# Patient Record
Sex: Female | Born: 1955
Health system: Southern US, Community
[De-identification: ages and names within clinical notes are randomized; demographics above are authoritative.]

## PROBLEM LIST (undated history)

## (undated) DIAGNOSIS — R51 Headache: Secondary | ICD-10-CM

## (undated) DIAGNOSIS — K219 Gastro-esophageal reflux disease without esophagitis: Secondary | ICD-10-CM

## (undated) DIAGNOSIS — F3281 Premenstrual dysphoric disorder: Secondary | ICD-10-CM

## (undated) DIAGNOSIS — Z9889 Other specified postprocedural states: Secondary | ICD-10-CM

## (undated) DIAGNOSIS — J302 Other seasonal allergic rhinitis: Secondary | ICD-10-CM

## (undated) DIAGNOSIS — R112 Nausea with vomiting, unspecified: Secondary | ICD-10-CM

## (undated) DIAGNOSIS — G5601 Carpal tunnel syndrome, right upper limb: Secondary | ICD-10-CM

## (undated) DIAGNOSIS — Z8719 Personal history of other diseases of the digestive system: Secondary | ICD-10-CM

## (undated) DIAGNOSIS — Z87898 Personal history of other specified conditions: Secondary | ICD-10-CM

## (undated) HISTORY — DX: Other seasonal allergic rhinitis: J30.2

## (undated) HISTORY — DX: Headache: R51

---

## 1998-03-21 ENCOUNTER — Other Ambulatory Visit: Admission: RE | Admit: 1998-03-21 | Discharge: 1998-03-21 | Payer: Self-pay | Admitting: Obstetrics and Gynecology

## 1998-06-27 ENCOUNTER — Encounter: Payer: Self-pay | Admitting: Orthopedic Surgery

## 1998-06-27 ENCOUNTER — Ambulatory Visit (HOSPITAL_COMMUNITY): Admission: RE | Admit: 1998-06-27 | Discharge: 1998-06-27 | Payer: Self-pay | Admitting: Orthopedic Surgery

## 1998-07-31 ENCOUNTER — Encounter: Payer: Self-pay | Admitting: Orthopedic Surgery

## 1998-07-31 ENCOUNTER — Ambulatory Visit (HOSPITAL_COMMUNITY): Admission: RE | Admit: 1998-07-31 | Discharge: 1998-07-31 | Payer: Self-pay | Admitting: Orthopedic Surgery

## 1999-04-22 ENCOUNTER — Other Ambulatory Visit: Admission: RE | Admit: 1999-04-22 | Discharge: 1999-04-22 | Payer: Self-pay | Admitting: Obstetrics and Gynecology

## 2000-04-29 ENCOUNTER — Other Ambulatory Visit: Admission: RE | Admit: 2000-04-29 | Discharge: 2000-04-29 | Payer: Self-pay | Admitting: Obstetrics and Gynecology

## 2001-05-22 ENCOUNTER — Other Ambulatory Visit: Admission: RE | Admit: 2001-05-22 | Discharge: 2001-05-22 | Payer: Self-pay | Admitting: Obstetrics and Gynecology

## 2002-07-05 ENCOUNTER — Other Ambulatory Visit: Admission: RE | Admit: 2002-07-05 | Discharge: 2002-07-05 | Payer: Self-pay | Admitting: Obstetrics and Gynecology

## 2003-07-09 ENCOUNTER — Other Ambulatory Visit: Admission: RE | Admit: 2003-07-09 | Discharge: 2003-07-09 | Payer: Self-pay | Admitting: Obstetrics and Gynecology

## 2004-05-15 ENCOUNTER — Other Ambulatory Visit: Admission: RE | Admit: 2004-05-15 | Discharge: 2004-05-15 | Payer: Self-pay | Admitting: Obstetrics and Gynecology

## 2004-09-21 ENCOUNTER — Encounter: Admission: RE | Admit: 2004-09-21 | Discharge: 2004-09-21 | Payer: Self-pay | Admitting: Sports Medicine

## 2004-10-19 ENCOUNTER — Encounter: Admission: RE | Admit: 2004-10-19 | Discharge: 2004-10-19 | Payer: Self-pay | Admitting: Sports Medicine

## 2005-05-05 ENCOUNTER — Other Ambulatory Visit: Admission: RE | Admit: 2005-05-05 | Discharge: 2005-05-05 | Payer: Self-pay | Admitting: Obstetrics and Gynecology

## 2009-10-30 ENCOUNTER — Encounter: Admission: RE | Admit: 2009-10-30 | Discharge: 2009-10-30 | Payer: Self-pay | Admitting: Gastroenterology

## 2011-02-15 ENCOUNTER — Encounter (HOSPITAL_BASED_OUTPATIENT_CLINIC_OR_DEPARTMENT_OTHER)
Admission: RE | Admit: 2011-02-15 | Discharge: 2011-02-15 | Disposition: A | Discharge status: Home / Self Care | Payer: BC Managed Care – PPO | Source: Ambulatory Visit | Attending: Orthopedic Surgery | Admitting: Orthopedic Surgery

## 2011-02-15 LAB — BASIC METABOLIC PANEL
BUN: 12 mg/dL (ref 6–23)
CO2: 24 mEq/L (ref 19–32)
Calcium: 8.7 mg/dL (ref 8.4–10.5)
Chloride: 105 mEq/L (ref 96–112)
Creatinine, Ser: 0.75 mg/dL (ref 0.50–1.10)
GFR calc Af Amer: >60 mL/min (ref >60)
GFR calc non Af Amer: >60 mL/min (ref >60)
Glucose, Bld: 93 mg/dL (ref 70–99)
Potassium: 4 mEq/L (ref 3.5–5.1)
Sodium: 139 mEq/L (ref 135–145)

## 2011-02-18 ENCOUNTER — Ambulatory Visit (HOSPITAL_BASED_OUTPATIENT_CLINIC_OR_DEPARTMENT_OTHER)
Admission: RE | Admit: 2011-02-18 | Discharge: 2011-02-19 | Disposition: A | Discharge status: Home / Self Care | Payer: BC Managed Care – PPO | Source: Ambulatory Visit | Attending: Orthopedic Surgery | Admitting: Orthopedic Surgery

## 2011-02-18 DIAGNOSIS — M2340 Loose body in knee, unspecified knee: Secondary | ICD-10-CM | POA: Insufficient documentation

## 2011-02-18 DIAGNOSIS — M2350 Chronic instability of knee, unspecified knee: Secondary | ICD-10-CM | POA: Insufficient documentation

## 2011-02-18 DIAGNOSIS — Z01810 Encounter for preprocedural cardiovascular examination: Secondary | ICD-10-CM | POA: Insufficient documentation

## 2011-02-18 DIAGNOSIS — Z01812 Encounter for preprocedural laboratory examination: Secondary | ICD-10-CM | POA: Insufficient documentation

## 2011-02-18 DIAGNOSIS — M23302 Other meniscus derangements, unspecified lateral meniscus, unspecified knee: Secondary | ICD-10-CM | POA: Insufficient documentation

## 2011-02-18 HISTORY — PX: KNEE ARTHROSCOPY W/ ACL RECONSTRUCTION: SHX1858

## 2011-02-18 LAB — POCT HEMOGLOBIN-HEMACUE: Hemoglobin: 11.9 g/dL — ABNORMAL LOW (ref 12.0–15.0)

## 2011-03-04 NOTE — Op Note (Signed)
NAME:  Erin Arnold, Erin Arnold NO.:  [id number]  MEDICAL RECORD NO.:  [id number]  LOCATION:                                 FACILITY:  PHYSICIAN:  Loreta Ave, M.D. DATE OF BIRTH:  1955-09-05  DATE OF PROCEDURE:  02/18/2011 DATE OF DISCHARGE:                              OPERATIVE REPORT   PREOPERATIVE DIAGNOSES: 1. Left knee, remote anterior cruciate ligament reconstruction in     1998. 2. Traumatic injury with tearing of graft and instability. 3. Lateral meniscus tear.  POSTOPERATIVE DIAGNOSES: 1. Left knee, remote anterior cruciate ligament reconstruction in     1998. 2. Traumatic injury with tearing of graft and instability. 3. Lateral meniscus tear. 4. Grade 2 and mild grade 3 changes, patellofemoral joint, lateral     tibial plateau. 5. New acute full-thickness grade 4 lesion, medial femoral condyle 4 x     4 mm with chondral loose body.  PROCEDURES: 1. Left knee exam under anesthesia, arthroscopy. 2. Tricompartmental chondroplasty. 3. Removal of loose fragment and microfracturing medial femoral     condyle. 4. Debridement of lateral meniscus tear. 5. Arthroscopic endoscopic ACL reconstruction. 6. Achilles allograft. 7. Bioabsorbable screw fixation above below with PushLock fixation     distally.  SURGEON:  Loreta Ave, MD  ASSISTANT:  Genene Churn. Barry Dienes, PA was present throughout the entire case necessary for timely completion of procedure.  ANESTHESIA:  General.  ESTIMATED BLOOD LOSS:  Minimal.  SPECIMENS:  None.  CULTURES:  None.  COMPLICATIONS:  None.  DRESSING:  Soft compressive with knee immobilizer.  TOURNIQUET TIME:  1 hour.  DESCRIPTION OF PROCEDURE:  The patient was brought to the operating room, placed on the operating table in supine position.  After adequate anesthesia had been obtained, knee examined.  Full motion.  Positive Lachman, drawer, and pivot shift.  Other ligament stable.  Tourniquet applied.  Prepped and  draped in usual sterile fashion.  Exsanguinated with elevation, Esmarch tourniquet inflated to 350 mmHg.  Two portals, one each medial and lateral parapatellar.  Arthroscope introduced.  Knee distended and inspected.  Some focal grade 2 chondromalacia patellofemoral joint on the patella and trochlea debrided.  Good tracking.  Lateral meniscus extensive, complex tearing anterior half. The anterior half removed, tapered into remaining meniscus.  Grade 2, mild grade 3 changes on the plateau debrided.  Medial meniscus intact. Most of the compartment are good except, right the middle of the weightbearing dome medial half.  There was a 4 x 4 mm full-thickness acute chondral fracture, unstable, debrided.  Trimmed to sharp margin. Treated with microfracturing.  Fluid pressure reduced to confirm good bleeding.  ACL and marked stretching attenuation was left.  Debrided out.  Some spurring into the notch debrided, opened with notchplasty.  A graft was prepared for 10-mm tunnels using Achilles with bone peg at one end.  The tunnels were recreated with a guidewire from an incision next to tibial tubercle out through the footprint, overdrilled with 10-mm reamer.  Femoral guide was inserted across the tunnel on the back cortex of the femur.  Guidewire driven, overdrilled there with a 10-mm reamer. Debris cleared throughout.  Tunnels were found  to be in good position. Tubing passer inserted across both tunnels out through a stab wound anterolateral thigh.  Nitinol wire brought in medial portal, out through femoral tunnel.  The graft attached to the passer, pulled in across the knee seating well on both tunnels.  Fixed with tensioning at 70 degrees with bioabsorbable 9-mm screws from inside out the femur and outside in the tibia.  Sutures were brought out the tibial tunnel and also reacted with a PushLock pre-drilled 3.5 mm distal.  When that was complete, the knee was examined.  Excellent stability.  Good  clearance of graft when viewed through full motion.  Very pleased with stability as well as motion and clearance.  Entire knee exam arthroscopically.  No other findings appreciated.  Instruments and fluid removed.  Portals closed with nylon.  Incision closed with subcutaneous and subcuticular Vicryl. Sterile compressive dressing applied.  Tourniquet deflated and removed. Knee immobilizer applied.  Anesthesia reversed.  Brought to the recovery room.  Tolerated surgery well.  No complications.     Loreta Ave, M.D.     DFM/MEDQ  D:  02/18/2011  T:  02/19/2011  Job:  [id number]  Electronically Signed by Mckinley Jewel M.D. on 03/04/2011 03:42:12 PM

## 2011-11-05 ENCOUNTER — Encounter: Payer: Self-pay | Admitting: Cardiovascular Disease

## 2011-11-05 ENCOUNTER — Ambulatory Visit (INDEPENDENT_AMBULATORY_CARE_PROVIDER_SITE_OTHER): Payer: BC Managed Care – PPO | Admitting: Cardiovascular Disease

## 2011-11-05 VITALS — BP 104/70 | HR 88 | Ht 68.0 in | Wt 180.0 lb

## 2011-11-05 DIAGNOSIS — R002 Palpitations: Secondary | ICD-10-CM | POA: Insufficient documentation

## 2011-11-05 LAB — LIPID PANEL
Cholesterol: 145 mg/dL (ref 0–200)
HDL: 32.3 mg/dL — ABNORMAL LOW (ref >39.00)
LDL Cholesterol: 90 mg/dL (ref 0–99)
Total CHOL/HDL Ratio: 4
Triglycerides: 112 mg/dL (ref 0.0–149.0)
VLDL: 22.4 mg/dL (ref 0.0–40.0)

## 2011-11-05 LAB — TSH: TSH: 1.14 u[IU]/mL (ref 0.35–5.50)

## 2011-11-05 LAB — BASIC METABOLIC PANEL
BUN: 13 mg/dL (ref 6–23)
CO2: 30 mEq/L (ref 19–32)
Calcium: 8.8 mg/dL (ref 8.4–10.5)
Chloride: 101 mEq/L (ref 96–112)
Creatinine, Ser: 0.9 mg/dL (ref 0.4–1.2)
GFR: 69.13 mL/min (ref >60.00)
Glucose, Bld: 98 mg/dL (ref 70–99)
Potassium: 3.9 mEq/L (ref 3.5–5.1)
Sodium: 137 mEq/L (ref 135–145)

## 2011-11-05 LAB — HEPATIC FUNCTION PANEL
ALT: 35 U/L (ref 0–35)
AST: 24 U/L (ref 0–37)
Albumin: 4 g/dL (ref 3.5–5.2)
Alkaline Phosphatase: 52 U/L (ref 39–117)
Bilirubin, Direct: 0 mg/dL (ref 0.0–0.3)
Total Bilirubin: 0.1 mg/dL — ABNORMAL LOW (ref 0.3–1.2)
Total Protein: 6.8 g/dL (ref 6.0–8.3)

## 2011-11-05 NOTE — Assessment & Plan Note (Addendum)
Erin Arnold presents with episodes of palpitations. Clinically these sound like premature ventricular contractions. She's on fairly high dose of HCTZ and only takes OTC potassium supplements.  We will check a fasting labs today as well as a TSH. We'll decrease her HCTZ to 25 mg a day. I'll see her again in 3 months for followup visit.  We talked about getting a monitor but at this point I think these arrhythmias are fairly benign.  She also has a very soft heart murmur but will wait on doing an echocardiogram.  She is agreeable to this.

## 2011-11-05 NOTE — Patient Instructions (Signed)
Decrease your dose of HCTZ to 25mg .  Your physician recommends that you schedule a follow-up appointment in: 3 months with Dr. Elease Hashimoto.  Labs today:  TSH, Lipids, Liver, BMET.

## 2011-11-05 NOTE — Progress Notes (Signed)
    Erin Arnold Date of Birth  October 14, 1955 [facility]     [place] Office  [address]    [address]   [address] [place], [place]  [address]    [place], [place]  [address] [phone]  Fax  [phone]  [phone]  Fax [phone]  Problem List: 1. Palpitation 2. Hx of colitis - following a C.Diff infection  History of Present Illness:  Erin Arnold is a 56 yo with a history of palpitations. She's had these palpitations for several years. These are typically described as heart irregularities followed by very strong heartbeats. On one occasion she's had a prolonged episode of palpitations.  These typically occur at night when she's relaxing. They also occur during the day. She does  not have any specific association for these palpitations. These episodes are associated with syncope, presyncope, chest pain, or shortness breath.  Current Outpatient Prescriptions on File Prior to Visit  Medication Sig Dispense Refill  . CA-MG-VIT D-L METHYLFOL-B6-B12 PO Take by mouth daily.      . hydrochlorothiazide (HYDRODIURIL) 50 MG tablet Take 50 mg by mouth daily.      . sertraline (ZOLOFT) 50 MG tablet Take 50 mg by mouth daily.      . traZODone (DESYREL) 50 MG tablet Take 50 mg by mouth at bedtime as needed.      . zolpidem (AMBIEN) 10 MG tablet Take 10 mg by mouth at bedtime as needed.        No Known Allergies  Past Medical History  Diagnosis Date  . Irregular heart rate   . Headache   . Seasonal allergies   . Constipation   . Acute infection of stomach lining 2011    C-Diff  . Colitis, ulcerative     Past Surgical History  Procedure Date  . Anterior cruciate ligament repair 1998, 02/15/12    Right and Left    History  Smoking status  . Former Smoker  . Quit date: 07/26/1976  Smokeless tobacco  . Not on file    History  Alcohol Use: Not on file    No family history on file.  Reviw of Systems:  Reviewed in the HPI.  All other systems are  negative.  Physical Exam: Blood pressure 104/70, pulse 88, height 5\' 8"  (1.727 m), weight 180 lb (81.647 kg). General: Well developed, well nourished, in no acute distress.  Head: Normocephalic, atraumatic, sclera non-icteric, mucus membranes are moist,   Neck: Supple. Carotids are 2 + without bruits. No JVD  Lungs: Clear bilaterally to auscultation.  Heart: regular rate.  normal  S1 S2. She has a 1-2 / 6 systolic murmur at the LSB.  No radiation.  Abdomen: Soft, non-tender, non-distended with normal bowel sounds. No hepatomegaly. No rebound/guarding. No masses.  Msk:  Strength and tone are normal  Extremities: No clubbing or cyanosis. No edema.  Distal pedal pulses are 2+ and equal bilaterally.  Neuro: Alert and oriented X 3. Moves all extremities spontaneously.  Psych:  Responds to questions appropriately with a normal affect.  ECG: 11/05/11- NSR. Normal ECG  Assessment / Plan:

## 2012-02-09 ENCOUNTER — Ambulatory Visit (INDEPENDENT_AMBULATORY_CARE_PROVIDER_SITE_OTHER): Payer: BC Managed Care – PPO | Admitting: Cardiovascular Disease

## 2012-02-09 ENCOUNTER — Encounter: Payer: Self-pay | Admitting: Cardiovascular Disease

## 2012-02-09 VITALS — BP 126/80 | HR 84 | Ht 68.0 in | Wt 179.0 lb

## 2012-02-09 DIAGNOSIS — I10 Essential (primary) hypertension: Secondary | ICD-10-CM

## 2012-02-09 MED ORDER — METOPROLOL SUCCINATE ER 25 MG PO TB24: 25.0000 mg | ORAL_TABLET | Freq: Every day | ORAL | Status: DC

## 2012-02-09 NOTE — Progress Notes (Signed)
Erin Arnold Date of Birth  09-Aug-1955 [facility]     [place] Office  [address]    [address]   [address] [place], [place]  [address]    [place], [place]  [address] [phone]  Fax  [phone]  [phone]  Fax [phone]  Problem List: 1. Palpitation 2. Hx of colitis - following a C.Diff infection  History of Present Illness:  Erin Arnold is a 56 yo with a history of palpitations. She's had these palpitations for several years. These are typically described as heart irregularities followed by very strong heartbeats. On one occasion she's had a prolonged episode of palpitations.  These typically occur at night when she's relaxing. They also occur during the day. She does  not have any specific association for these palpitations. These episodes are associated with syncope, presyncope, chest pain, or shortness breath.  She has been having some chest pain / jaw pain.  She has had jaw pain constantly for the past several weeks.  Worsening fatigue after exercising.    Current Outpatient Prescriptions on File Prior to Visit  Medication Sig Dispense Refill  . CA-MG-VIT D-L METHYLFOL-B6-B12 PO Take by mouth daily.      . fish oil-omega-3 fatty acids 1000 MG capsule Take 1 g by mouth daily.       . hydrochlorothiazide (HYDRODIURIL) 50 MG tablet Take 0.5 tablets (25 mg total) by mouth daily.      [name] LYSINE PO Take by mouth daily.      . magnesium 30 MG tablet 30 mg daily.      . Misc Natural Products (OSTEO BI-FLEX ADV JOINT SHIELD PO) Take by mouth daily.      . polyethylene glycol (MIRALAX / GLYCOLAX) packet Take 17 g by mouth daily.      . Probiotic Product (PROBIOTIC FORMULA PO) Take by mouth daily.      . sertraline (ZOLOFT) 50 MG tablet Take 50 mg by mouth daily.      . traZODone (DESYREL) 50 MG tablet Take 50 mg by mouth at bedtime as needed.      . zolpidem (AMBIEN) 10 MG tablet Take 10 mg by mouth at bedtime as needed.        No Known Allergies  Past  Medical History  Diagnosis Date  . Irregular heart rate   . Headache   . Seasonal allergies   . Constipation   . Acute infection of stomach lining 2011    C-Diff  . Colitis, ulcerative     Past Surgical History  Procedure Date  . Anterior cruciate ligament repair 1998, 02/15/12    Right and Left    History  Smoking status  . Former Smoker  . Quit date: 07/26/1976  Smokeless tobacco  . Not on file    History  Alcohol Use: Not on file    No family history on file.  Reviw of Systems:  Reviewed in the HPI.  All other systems are negative.  Physical Exam: Blood pressure 126/80, pulse 84, height 5\' 8"  (1.727 m), weight 179 lb (81.194 kg). General: Well developed, well nourished, in no acute distress.  Head: Normocephalic, atraumatic, sclera non-icteric, mucus membranes are moist,   Neck: Supple. Carotids are 2 + without bruits. No JVD  Lungs: Clear bilaterally to auscultation.  Heart: regular rate.  normal  S1 S2. She has a 1-2 / 6 systolic murmur at the LSB.  No radiation.  Abdomen: Soft, non-tender, non-distended with normal bowel  sounds. No hepatomegaly. No rebound/guarding. No masses.  Msk:  Strength and tone are normal  Extremities: No clubbing or cyanosis. No edema.  Distal pedal pulses are 2+ and equal bilaterally.  Neuro: Alert and oriented X 3. Moves all extremities spontaneously.  Psych:  Responds to questions appropriately with a normal affect.  ECG: 11/05/11- NSR. Normal ECG  Assessment / Plan:

## 2012-02-09 NOTE — Assessment & Plan Note (Signed)
Erin Arnold continues to have episodes of palpitations. I suspect a lot of this is due to anxiety. None of her symptoms sound like coronary disease. She has chronic jaw pain but I do not think this is related tocoronary problems.  I've encouraged her to exercise on regular basis. We'll add metoprolol XL 25 mg a day to her medical regimen. I see her back in 3 months for followup office visit. She will call sooner if she has any additional problems.

## 2012-02-09 NOTE — Patient Instructions (Signed)
Your physician recommends that you schedule a follow-up appointment in: 3 months   Your physician has recommended you make the following change in your medication:   Start toprol xl 25 mg once a day.  Your physician recommends that you return for lab work in: 3 months/ bmet  REDUCE HIGH SODIUM FOODS LIKE CANNED SOUP, GRAVY, SAUCES, READY PREPARED FOODS LIKE FROZEN FOODS; LEAN CUISINE, LASAGNA. BACON, SAUSAGE, LUNCH MEAT, FAST FOODS.[name]    DASH Diet The DASH diet stands for "Dietary Approaches to Stop Hypertension." It is a healthy eating plan that has been shown to reduce high blood pressure (hypertension) in as little as 14 days, while also possibly providing other significant health benefits. These other health benefits include reducing the risk of breast cancer after menopause and reducing the risk of type 2 diabetes, heart disease, colon cancer, and stroke. Health benefits also include weight loss and slowing kidney failure in patients with chronic kidney disease.  DIET GUIDELINES  Limit salt (sodium). Your diet should contain less than 1500 mg of sodium daily.   Limit refined or processed carbohydrates. Your diet should include mostly whole grains. Desserts and added sugars should be used sparingly.   Include small amounts of heart-healthy fats. These types of fats include nuts, oils, and tub margarine. Limit saturated and trans fats. These fats have been shown to be harmful in the body.  CHOOSING FOODS  The following food groups are based on a 2000 calorie diet. See your Registered Dietitian for individual calorie needs. Grains and Grain Products (6 to 8 servings daily)  Eat More Often: Whole-wheat bread, brown rice, whole-grain or wheat pasta, quinoa, popcorn without added fat or salt (air popped).   Eat Less Often: White bread, white pasta, white rice, cornbread.  Vegetables (4 to 5 servings daily)  Eat More Often: Fresh, frozen, and canned vegetables. Vegetables may be raw,  steamed, roasted, or grilled with a minimal amount of fat.   Eat Less Often/Avoid: Creamed or fried vegetables. Vegetables in a cheese sauce.  Fruit (4 to 5 servings daily)  Eat More Often: All fresh, canned (in natural juice), or frozen fruits. Dried fruits without added sugar. One hundred percent fruit juice ( cup [237 mL] daily).   Eat Less Often: Dried fruits with added sugar. Canned fruit in light or heavy syrup.  Foot Locker, Fish, and Poultry (2 servings or less daily. One serving is 3 to 4 oz [85-114 g]).  Eat More Often: Ninety percent or leaner ground beef, tenderloin, sirloin. Round cuts of beef, chicken breast, [place] breast. All fish. Grill, bake, or broil your meat. Nothing should be fried.   Eat Less Often/Avoid: Fatty cuts of meat, [place], or chicken leg, thigh, or wing. Fried cuts of meat or fish.  Dairy (2 to 3 servings)  Eat More Often: Low-fat or fat-free milk, low-fat plain or light yogurt, reduced-fat or part-skim cheese.   Eat Less Often/Avoid: Milk (whole, 2%, skim, or chocolate).Whole milk yogurt. Full-fat cheeses.  Nuts, Seeds, and Legumes (4 to 5 servings per week)  Eat More Often: All without added salt.   Eat Less Often/Avoid: Salted nuts and seeds, canned beans with added salt.  Fats and Sweets (limited)  Eat More Often: Vegetable oils, tub margarines without trans fats, sugar-free gelatin. Mayonnaise and salad dressings.   Eat Less Often/Avoid: Coconut oils, palm oils, butter, stick margarine, cream, half and half, cookies, candy, pie.  FOR MORE INFORMATION The Dash Diet Eating Plan: www.dashdiet.org Document Released: 07/01/2011 Document Reviewed:  06/21/2011 Exit[employer] Patient Information 2012 [place], [place].

## 2012-04-14 ENCOUNTER — Other Ambulatory Visit: Payer: Self-pay | Admitting: Orthopedic Surgery

## 2012-04-14 DIAGNOSIS — M25522 Pain in left elbow: Secondary | ICD-10-CM

## 2012-04-16 ENCOUNTER — Ambulatory Visit
Admission: RE | Admit: 2012-04-16 | Discharge: 2012-04-16 | Disposition: A | Discharge status: Home / Self Care | Payer: BC Managed Care – PPO | Source: Ambulatory Visit | Attending: Orthopedic Surgery | Admitting: Orthopedic Surgery

## 2012-04-16 DIAGNOSIS — M25522 Pain in left elbow: Secondary | ICD-10-CM

## 2012-05-15 ENCOUNTER — Encounter: Payer: Self-pay | Admitting: Cardiovascular Disease

## 2012-05-15 ENCOUNTER — Other Ambulatory Visit (INDEPENDENT_AMBULATORY_CARE_PROVIDER_SITE_OTHER): Payer: BC Managed Care – PPO

## 2012-05-15 ENCOUNTER — Ambulatory Visit (INDEPENDENT_AMBULATORY_CARE_PROVIDER_SITE_OTHER): Payer: BC Managed Care – PPO | Admitting: Cardiovascular Disease

## 2012-05-15 VITALS — BP 112/75 | HR 74 | Ht 68.0 in | Wt 182.4 lb

## 2012-05-15 DIAGNOSIS — R002 Palpitations: Secondary | ICD-10-CM

## 2012-05-15 DIAGNOSIS — I10 Essential (primary) hypertension: Secondary | ICD-10-CM

## 2012-05-15 LAB — BASIC METABOLIC PANEL
BUN: 14 mg/dL (ref 6–23)
CO2: 28 mEq/L (ref 19–32)
Calcium: 8.7 mg/dL (ref 8.4–10.5)
Chloride: 102 mEq/L (ref 96–112)
Creatinine, Ser: 1 mg/dL (ref 0.4–1.2)
GFR: 64.82 mL/min (ref >60.00)
Glucose, Bld: 93 mg/dL (ref 70–99)
Potassium: 3.6 mEq/L (ref 3.5–5.1)
Sodium: 137 mEq/L (ref 135–145)

## 2012-05-15 MED ORDER — METOPROLOL SUCCINATE 12.5 MG HALF TABLET: 12.5000 mg | ORAL_TABLET | Freq: Every day | ORAL | Status: DC

## 2012-05-15 NOTE — Patient Instructions (Addendum)
Your physician recommends that you return for lab work in: bmet today to check kidneys and k+  Your physician has recommended you make the following change in your medication: lower toprol to 12.5mg  daily  Your physician wants you to follow-up in: 6 months  You will receive a reminder letter in the mail two months in advance. If you don't receive a letter, please call our office to schedule the follow-up appointment.

## 2012-05-15 NOTE — Progress Notes (Signed)
Erin Arnold Date of Birth  08-Dec-1955 [facility]     [place] Office  [address]    [address]   [address] [place], [place]  [address]    [place], [place]  [address] [phone]  Fax  [phone]  [phone]  Fax [phone]  Problem List: 1. Palpitations 2. Hx of colitis - following a C.Diff infection  History of Present Illness:  Erin Arnold is a 56 yo with a history of palpitations. She's had these palpitations for several years. These are typically described as heart irregularities followed by very strong heartbeats. On one occasion she's had a prolonged episode of palpitations.  These typically occur at night when she's relaxing. They also occur during the day. She does  not have any specific association for these palpitations. These episodes are associated with syncope, presyncope, chest pain, or shortness breath.  She has not had any problems.  Her palpitations have been well controlled. She is scheduled to have arm surgery.     Current Outpatient Prescriptions on File Prior to Visit  Medication Sig Dispense Refill  . CA-MG-VIT D-L METHYLFOL-B6-B12 PO Take by mouth daily.      . fish oil-omega-3 fatty acids 1000 MG capsule Take 1 g by mouth daily.       . hydrochlorothiazide (HYDRODIURIL) 50 MG tablet Take 0.5 tablets (25 mg total) by mouth daily.      [name] LYSINE PO Take by mouth daily.      . magnesium 30 MG tablet 30 mg daily.      . metoprolol succinate (TOPROL-XL) 25 MG 24 hr tablet Take 1 tablet (25 mg total) by mouth daily.  30 tablet  5  . Misc Natural Products (OSTEO BI-FLEX ADV JOINT SHIELD PO) Take by mouth daily.      . polyethylene glycol (MIRALAX / GLYCOLAX) packet Take 17 g by mouth daily.      . Probiotic Product (PROBIOTIC FORMULA PO) Take by mouth daily.      . sertraline (ZOLOFT) 50 MG tablet Take 50 mg by mouth daily.      . traZODone (DESYREL) 50 MG tablet Take 50 mg by mouth at bedtime as needed.      . zolpidem (AMBIEN) 10 MG  tablet Take 10 mg by mouth at bedtime as needed.        No Known Allergies  Past Medical History  Diagnosis Date  . Irregular heart rate   . Headache   . Seasonal allergies   . Constipation   . Acute infection of stomach lining 2011    C-Diff  . Colitis, ulcerative     Past Surgical History  Procedure Date  . Anterior cruciate ligament repair 1998, 02/15/12    Right and Left    History  Smoking status  . Former Smoker  . Quit date: 07/26/1976  Smokeless tobacco  . Not on file    History  Alcohol Use: Not on file    No family history on file.  Reviw of Systems:  Reviewed in the HPI.  All other systems are negative.  Physical Exam: Blood pressure 112/75, pulse 74, height 5\' 8"  (1.727 m), weight 182 lb 6.4 oz (82.736 kg). General: Well developed, well nourished, in no acute distress.  Head: Normocephalic, atraumatic, sclera non-icteric, mucus membranes are moist,   Neck: Supple. Carotids are 2 + without bruits. No JVD  Lungs: Clear bilaterally to auscultation.  Heart: regular rate.  normal  S1 S2. She has  a 1-2 / 6 systolic murmur at the LSB.  No radiation.  Abdomen: Soft, non-tender, non-distended with normal bowel sounds. No hepatomegaly. No rebound/guarding. No masses.  Msk:  Strength and tone are normal  Extremities: No clubbing or cyanosis. No edema.  Distal pedal pulses are 2+ and equal bilaterally.  Neuro: Alert and oriented X 3. Moves all extremities spontaneously.  Psych:  Responds to questions appropriately with a normal affect.  ECG:  Assessment / Plan:

## 2012-05-15 NOTE — Assessment & Plan Note (Addendum)
Her palpitations are well controlled. She states that the metoprolol makes her feel a little bit sluggish. We will decrease the Toprol to 12.5 mg every day.  She's scheduled to have left arm surgery. She is at low risk for her upcoming arm surgery.  I'll see her again in 6 months. We'll check fasting labs at that time. We'll check a basic metabolic profile today

## 2012-05-15 NOTE — Progress Notes (Signed)
Received request for cardiac clearance from [facility] ortho, paper filled out and taken to MR to be faxed. Pt cleared.

## 2012-05-26 HISTORY — PX: WRIST SURGERY: SHX841

## 2012-09-09 ENCOUNTER — Other Ambulatory Visit: Payer: Self-pay

## 2012-11-10 ENCOUNTER — Ambulatory Visit (INDEPENDENT_AMBULATORY_CARE_PROVIDER_SITE_OTHER): Payer: BC Managed Care – PPO | Admitting: Cardiovascular Disease

## 2012-11-10 ENCOUNTER — Other Ambulatory Visit (INDEPENDENT_AMBULATORY_CARE_PROVIDER_SITE_OTHER): Payer: BC Managed Care – PPO

## 2012-11-10 ENCOUNTER — Encounter: Payer: Self-pay | Admitting: Cardiovascular Disease

## 2012-11-10 VITALS — BP 112/70 | HR 76 | Ht 68.0 in | Wt 180.0 lb

## 2012-11-10 DIAGNOSIS — R002 Palpitations: Secondary | ICD-10-CM

## 2012-11-10 DIAGNOSIS — E785 Hyperlipidemia, unspecified: Secondary | ICD-10-CM

## 2012-11-10 DIAGNOSIS — R0989 Other specified symptoms and signs involving the circulatory and respiratory systems: Secondary | ICD-10-CM

## 2012-11-10 LAB — LIPID PANEL
Cholesterol: 183 mg/dL (ref 0–200)
HDL: 35.8 mg/dL — ABNORMAL LOW (ref >39.00)
LDL Cholesterol: 126 mg/dL — ABNORMAL HIGH (ref 0–99)
Total CHOL/HDL Ratio: 5
Triglycerides: 105 mg/dL (ref 0.0–149.0)
VLDL: 21 mg/dL (ref 0.0–40.0)

## 2012-11-10 LAB — BASIC METABOLIC PANEL
BUN: 20 mg/dL (ref 6–23)
CO2: 30 mEq/L (ref 19–32)
Calcium: 9.1 mg/dL (ref 8.4–10.5)
Chloride: 103 mEq/L (ref 96–112)
Creatinine, Ser: 0.9 mg/dL (ref 0.4–1.2)
GFR: 68 mL/min (ref >60.00)
Glucose, Bld: 88 mg/dL (ref 70–99)
Potassium: 4 mEq/L (ref 3.5–5.1)
Sodium: 139 mEq/L (ref 135–145)

## 2012-11-10 LAB — HEPATIC FUNCTION PANEL
ALT: 17 U/L (ref 0–35)
AST: 19 U/L (ref 0–37)
Albumin: 3.9 g/dL (ref 3.5–5.2)
Alkaline Phosphatase: 52 U/L (ref 39–117)
Bilirubin, Direct: 0.1 mg/dL (ref 0.0–0.3)
Total Bilirubin: 0.8 mg/dL (ref 0.3–1.2)
Total Protein: 6.8 g/dL (ref 6.0–8.3)

## 2012-11-10 NOTE — Assessment & Plan Note (Signed)
Erin Arnold  Has a history of dyslipidemia. She does not want to take a statin. Fortunately, her last lipid levels looked okay. We are scheduled to check fasting lipids today.  I will be seeing her on an as-needed basis. We'll make further recommendations after her lipid levels are back.

## 2012-11-10 NOTE — Assessment & Plan Note (Signed)
She seems to be doing well. I suspect that she's having premature atrial contractions or perhaps premature ventricular contractions. I don't think that she needs any further followup. We'll see her on an as needed basis assuming that her lab work

## 2012-11-10 NOTE — Progress Notes (Signed)
Erin Arnold Date of Birth  11-Nov-1955 Erin Arnold Erin Arnold     Erin Arnold  [address]    [address]   [address] [place], [place]  [address]    [place], [place]  [address] [phone]  Fax  [phone]  [phone]  Fax [phone]  Problem List: 1. Palpitations 2. Hx of colitis - following a C.Diff infection  History of Present Illness:  Erin Arnold is a 57 yo with a history of palpitations. She's had these palpitations for several years. These are typically described as heart irregularities followed by very strong heartbeats. On one occasion she's had a prolonged episode of palpitations.  These typically occur at night when she's relaxing. They also occur during the day. She does  not have any specific association for these palpitations. These episodes are associated with syncope, presyncope, chest pain, or shortness breath.  She has not had any problems.  Her palpitations have been well controlled. She is scheduled to have arm surgery.    November 10, 2012:  Erin Arnold is doing well from a cardiac standpoint.  She still have occasional palps.   She has had left radial nerve decompression   Current Outpatient Prescriptions on File Prior to Visit  Medication Sig Dispense Refill  . CA-MG-VIT D-L METHYLFOL-B6-B12 PO Take by mouth daily.      . fish oil-omega-3 fatty acids 1000 MG capsule Take 1 g by mouth daily.       . hydrochlorothiazide (HYDRODIURIL) 50 MG tablet Take 0.5 tablets (25 mg total) by mouth daily.      [name] LYSINE PO Take by mouth daily.      . magnesium 30 MG tablet 30 mg daily.      . metoprolol succinate (TOPROL-XL) 12.5 mg TB24 Take 0.5 tablets (12.5 mg total) by mouth daily.  30 tablet  5  . Misc Natural Products (OSTEO BI-FLEX ADV JOINT SHIELD PO) Take by mouth daily.      . polyethylene glycol (MIRALAX / GLYCOLAX) packet Take 17 g by mouth daily.      . Probiotic Product (PROBIOTIC FORMULA PO) Take by mouth daily.      . sertraline (ZOLOFT) 50 MG  tablet Take 50 mg by mouth daily.      . traZODone (DESYREL) 50 MG tablet Take 50 mg by mouth at bedtime as needed.      . zolpidem (AMBIEN) 10 MG tablet Take 10 mg by mouth at bedtime as needed.       No current facility-administered medications on file prior to visit.    No Known Allergies  Past Medical History  Diagnosis Date  . Irregular heart rate   . Headache   . Seasonal allergies   . Constipation   . Acute infection of stomach lining 2011    C-Diff  . Colitis, ulcerative     Past Surgical History  Procedure Laterality Date  . Anterior cruciate ligament repair  1998, 02/15/12    Right and Left    History  Smoking status  . Former Smoker  . Quit date: 07/26/1976  Smokeless tobacco  . Not on file    History  Alcohol Use: Not on file    No family history on file.  Reviw of Systems:  Reviewed in the HPI.  All other systems are negative.  Physical Exam: Blood pressure 112/70, pulse 76, height 5\' 8"  (1.727 m), weight 180 lb (81.647 kg), SpO2 98.00%. General: Well developed, well nourished, in no acute distress.  Head: Normocephalic, atraumatic, sclera non-icteric, mucus membranes are moist,   Neck: Supple. Carotids are 2 + without bruits. No JVD  Lungs: Clear bilaterally to auscultation.  Heart: regular rate.  normal  S1 S2. She has a 1-2 / 6 systolic murmur at the LSB.  No radiation.  Abdomen: Soft, non-tender, non-distended with normal bowel sounds. No hepatomegaly. No rebound/guarding. No masses.  Msk:  Strength and tone are normal  Extremities: No clubbing or cyanosis. No edema.  Distal pedal pulses are 2+ and equal bilaterally.  Neuro: Alert and oriented X 3. Moves all extremities spontaneously.  Psych:  Responds to questions appropriately with a normal affect.  ECG:  Assessment / Plan:

## 2012-11-10 NOTE — Patient Instructions (Addendum)
Your physician recommends that you schedule a follow-up appointment in: AS NEEDED BASIS  Your physician recommends that you continue on your current medications as directed. Please refer to the Current Medication list given to you today.   Your physician recommends that you return for a FASTING lipid profile: today bmet lipid hepatic

## 2012-11-13 ENCOUNTER — Encounter: Payer: Self-pay | Admitting: *Deleted

## 2013-01-19 ENCOUNTER — Other Ambulatory Visit: Payer: Self-pay | Admitting: Orthopedic Surgery

## 2013-01-19 DIAGNOSIS — M1990 Unspecified osteoarthritis, unspecified site: Secondary | ICD-10-CM

## 2013-01-19 DIAGNOSIS — M25522 Pain in left elbow: Secondary | ICD-10-CM

## 2013-01-27 ENCOUNTER — Ambulatory Visit
Admission: RE | Admit: 2013-01-27 | Discharge: 2013-01-27 | Disposition: A | Discharge status: Home / Self Care | Payer: BC Managed Care – PPO | Source: Ambulatory Visit | Attending: Orthopedic Surgery | Admitting: Orthopedic Surgery

## 2013-01-27 DIAGNOSIS — M1990 Unspecified osteoarthritis, unspecified site: Secondary | ICD-10-CM

## 2013-01-27 DIAGNOSIS — M25522 Pain in left elbow: Secondary | ICD-10-CM

## 2013-02-02 ENCOUNTER — Encounter (HOSPITAL_BASED_OUTPATIENT_CLINIC_OR_DEPARTMENT_OTHER): Payer: Self-pay | Admitting: *Deleted

## 2013-02-02 NOTE — Pre-Procedure Instructions (Signed)
To come for BMET 

## 2013-02-06 ENCOUNTER — Encounter (HOSPITAL_BASED_OUTPATIENT_CLINIC_OR_DEPARTMENT_OTHER)
Admission: RE | Admit: 2013-02-06 | Discharge: 2013-02-06 | Disposition: A | Discharge status: Home / Self Care | Payer: BC Managed Care – PPO | Source: Ambulatory Visit | Attending: Orthopedic Surgery | Admitting: Orthopedic Surgery

## 2013-02-06 ENCOUNTER — Other Ambulatory Visit: Payer: Self-pay | Admitting: Orthopedic Surgery

## 2013-02-06 LAB — BASIC METABOLIC PANEL
BUN: 16 mg/dL (ref 6–23)
CO2: 26 mEq/L (ref 19–32)
Calcium: 9.5 mg/dL (ref 8.4–10.5)
Chloride: 101 mEq/L (ref 96–112)
Creatinine, Ser: 0.91 mg/dL (ref 0.50–1.10)
GFR calc Af Amer: 80 mL/min — ABNORMAL LOW (ref >90)
GFR calc non Af Amer: 69 mL/min — ABNORMAL LOW (ref >90)
Glucose, Bld: 92 mg/dL (ref 70–99)
Potassium: 3.7 mEq/L (ref 3.5–5.1)
Sodium: 139 mEq/L (ref 135–145)

## 2013-02-07 ENCOUNTER — Other Ambulatory Visit: Payer: Self-pay | Admitting: Orthopedic Surgery

## 2013-02-07 NOTE — H&P (Signed)
Erin Arnold/[facility] [address]   [address] [place], [place] [address] [phone] A Division of [facility]  Erin Arnold, M.D.   Robert A. Thurston Hole, M.D.   Burnell Blanks, M.D.   Eulas Post, M.D.   Lunette Stands, M.D Buford Dresser, M.D.  Charlsie Quest, M.D.   Estell Harpin, M.D.   Melina Fiddler, M.D. Genene Churn. Barry Dienes, PA-C            Kirstin A. Shepperson, PA-C Josh Jackson, PA-C [place], [place]   RE: Erin Arnold, Erin Arnold                                [phone]      DOB: [birth year] PROGRESS NOTE: 02-02-13 I had Aleen come back in for follow up and reevaluation after I got the results of her MRI.  I was somewhat taken aback, surprised, by what the scan said.  It does however answer all our questions as to why she is so symptomatic.  Previous workup and treatment including primarily radial tunnel syndrome with a successful radial tunnel release.  At that time her scan showed a little hyperintensity ulnar nerve in the cubital tunnel, but all other structures looked great.  Her recent scan that I have reviewed shows a new large intrasubstance tear of the common extensor tendon at the lateral epicondyle.  This starts on the deep side, which is why she responded to intraarticular injection.  I still think the superficial extensors may be intact, but this is a fairly large tear.  Not complete, not retracted. Of note, there is resolution of the abnormal signal in the ulnar nerve cubital tunnel. No medial epicondylitis.  There is a little prominence of her olecranon posteriorly, but really not a picture of significant bony impingement there.  No other obvious loose bodies.     EXAMINATION: Repeating her exam I can get her through full motion, although she does not like the extreme of flexion and extension.  Nothing on the medial side.  She is very sore globally on the lateral side from anterior to posterior.  In light of the scan  and looking at her again her degree of exquisite tenderness at the lateral epicondyle is more profound than I had appreciated previously.  No evidence of recurrent radial tunnel.    DISPOSITION:  I spent more than 25 minutes with Erin Arnold reviewing her old scan, her new scan, her findings, her response to injection and our current situation.  Something needs to be done as it is intolerable.  Both she and I understand and agree.  The plan is going to be exam under anesthesia.  Open lateral approach with debridement ECRB tendon.  If there is a significant tear of all the tendons this is going to involve a repair with an anchor and then bracing for six weeks.  At the time of intervention I will also do an arthroscopy to look at all other structures in her elbow.  Based on what I have now I don't think this is going to involve a significant posterior decompression.  Procedure, risks, benefits and complications reviewed in detail.  The only question here is how much I am going to have to limit her post-op and how much I am going to have to repair.  I have revised her paperwork for operative intervention.  I will see her at that time.  Erin Arnold, M.D.   Electronically verified by Erin Arnold, M.D. DFM:jjh D 02-02-13 T 02-05-13

## 2013-02-08 ENCOUNTER — Ambulatory Visit (HOSPITAL_BASED_OUTPATIENT_CLINIC_OR_DEPARTMENT_OTHER)
Admission: RE | Admit: 2013-02-08 | Discharge: 2013-02-08 | Disposition: A | Discharge status: Home / Self Care | Payer: BC Managed Care – PPO | Source: Ambulatory Visit | Attending: Orthopedic Surgery | Admitting: Orthopedic Surgery

## 2013-02-08 ENCOUNTER — Encounter (HOSPITAL_BASED_OUTPATIENT_CLINIC_OR_DEPARTMENT_OTHER): Payer: Self-pay | Admitting: *Deleted

## 2013-02-08 ENCOUNTER — Encounter (HOSPITAL_BASED_OUTPATIENT_CLINIC_OR_DEPARTMENT_OTHER)
Admission: RE | Disposition: A | Discharge status: Home / Self Care | Payer: Self-pay | Source: Ambulatory Visit | Attending: Orthopedic Surgery

## 2013-02-08 ENCOUNTER — Ambulatory Visit (HOSPITAL_BASED_OUTPATIENT_CLINIC_OR_DEPARTMENT_OTHER): Payer: BC Managed Care – PPO | Admitting: Anesthesiology

## 2013-02-08 ENCOUNTER — Encounter (HOSPITAL_BASED_OUTPATIENT_CLINIC_OR_DEPARTMENT_OTHER): Payer: Self-pay | Admitting: Anesthesiology

## 2013-02-08 DIAGNOSIS — M24029 Loose body in unspecified elbow: Secondary | ICD-10-CM | POA: Insufficient documentation

## 2013-02-08 DIAGNOSIS — K5900 Constipation, unspecified: Secondary | ICD-10-CM | POA: Insufficient documentation

## 2013-02-08 DIAGNOSIS — J309 Allergic rhinitis, unspecified: Secondary | ICD-10-CM | POA: Insufficient documentation

## 2013-02-08 DIAGNOSIS — Z79899 Other long term (current) drug therapy: Secondary | ICD-10-CM | POA: Insufficient documentation

## 2013-02-08 DIAGNOSIS — M7710 Lateral epicondylitis, unspecified elbow: Secondary | ICD-10-CM | POA: Insufficient documentation

## 2013-02-08 DIAGNOSIS — Z87891 Personal history of nicotine dependence: Secondary | ICD-10-CM | POA: Insufficient documentation

## 2013-02-08 HISTORY — PX: ELBOW ARTHROSCOPY: SHX614

## 2013-02-08 HISTORY — DX: Premenstrual dysphoric disorder: F32.81

## 2013-02-08 HISTORY — DX: Personal history of other diseases of the digestive system: Z87.19

## 2013-02-08 HISTORY — DX: Personal history of other specified conditions: Z87.898

## 2013-02-08 LAB — POCT HEMOGLOBIN-HEMACUE: Hemoglobin: 13.2 g/dL (ref 12.0–15.0)

## 2013-02-08 SURGERY — ARTHROSCOPY, ELBOW
Anesthesia: General | Site: Elbow | Laterality: Left | Wound class: Clean

## 2013-02-08 MED ORDER — PROPOFOL 10 MG/ML IV BOLUS
INTRAVENOUS | Status: DC | PRN
  Administered 2013-02-08: 200 mg via INTRAVENOUS

## 2013-02-08 MED ORDER — SODIUM CHLORIDE 0.9 % IR SOLN
Status: DC | PRN
  Administered 2013-02-08: 2000 mL

## 2013-02-08 MED ORDER — METOCLOPRAMIDE HCL 5 MG PO TABS: 5.0000 mg | ORAL_TABLET | Freq: Three times a day (TID) | ORAL | Status: DC | PRN

## 2013-02-08 MED ORDER — OXYCODONE-ACETAMINOPHEN 5-325 MG PO TABS: 1.0000 | ORAL_TABLET | ORAL | Status: DC | PRN

## 2013-02-08 MED ORDER — METOCLOPRAMIDE HCL 5 MG/ML IJ SOLN: 10.0000 mg | Freq: Once | INTRAMUSCULAR | Status: DC | PRN

## 2013-02-08 MED ORDER — HYDROMORPHONE HCL PF 1 MG/ML IJ SOLN
0.2500 mg | INTRAMUSCULAR | Status: DC | PRN
  Administered 2013-02-08 (×3): 0.5 mg via INTRAVENOUS

## 2013-02-08 MED ORDER — ONDANSETRON HCL 4 MG/2ML IJ SOLN
INTRAMUSCULAR | Status: DC | PRN
  Administered 2013-02-08: 4 mg via INTRAVENOUS

## 2013-02-08 MED ORDER — LACTATED RINGERS IV SOLN: INTRAVENOUS | Status: DC

## 2013-02-08 MED ORDER — MIDAZOLAM HCL 2 MG/ML PO SYRP: 12.0000 mg | ORAL_SOLUTION | Freq: Once | ORAL | Status: DC | PRN

## 2013-02-08 MED ORDER — FENTANYL CITRATE 0.05 MG/ML IJ SOLN: 50.0000 ug | INTRAMUSCULAR | Status: DC | PRN

## 2013-02-08 MED ORDER — METOCLOPRAMIDE HCL 5 MG/ML IJ SOLN
INTRAMUSCULAR | Status: DC | PRN
  Administered 2013-02-08: 10 mg via INTRAVENOUS

## 2013-02-08 MED ORDER — LACTATED RINGERS IV SOLN
INTRAVENOUS | Status: DC
  Administered 2013-02-08: 07:00:00 via INTRAVENOUS

## 2013-02-08 MED ORDER — LIDOCAINE HCL (CARDIAC) 20 MG/ML IV SOLN
INTRAVENOUS | Status: DC | PRN
  Administered 2013-02-08: 50 mg via INTRAVENOUS

## 2013-02-08 MED ORDER — MIDAZOLAM HCL 2 MG/2ML IJ SOLN: 1.0000 mg | INTRAMUSCULAR | Status: DC | PRN

## 2013-02-08 MED ORDER — OXYCODONE HCL 5 MG/5ML PO SOLN: 5.0000 mg | Freq: Once | ORAL | Status: DC | PRN

## 2013-02-08 MED ORDER — BUPIVACAINE HCL (PF) 0.5 % IJ SOLN
INTRAMUSCULAR | Status: DC | PRN
  Administered 2013-02-08: 5 mL via INTRA_ARTICULAR

## 2013-02-08 MED ORDER — MIDAZOLAM HCL 5 MG/5ML IJ SOLN
INTRAMUSCULAR | Status: DC | PRN
  Administered 2013-02-08: 2 mg via INTRAVENOUS

## 2013-02-08 MED ORDER — KETOROLAC TROMETHAMINE 30 MG/ML IJ SOLN
INTRAMUSCULAR | Status: DC | PRN
  Administered 2013-02-08: 30 mg via INTRAVENOUS

## 2013-02-08 MED ORDER — CHLORHEXIDINE GLUCONATE 4 % EX LIQD: 60.0000 mL | Freq: Once | CUTANEOUS | Status: DC

## 2013-02-08 MED ORDER — ONDANSETRON HCL 4 MG PO TABS: 4.0000 mg | ORAL_TABLET | Freq: Four times a day (QID) | ORAL | Status: DC | PRN

## 2013-02-08 MED ORDER — ONDANSETRON HCL 4 MG/2ML IJ SOLN: 4.0000 mg | Freq: Four times a day (QID) | INTRAMUSCULAR | Status: DC | PRN

## 2013-02-08 MED ORDER — METOCLOPRAMIDE HCL 5 MG/ML IJ SOLN: 5.0000 mg | Freq: Three times a day (TID) | INTRAMUSCULAR | Status: DC | PRN

## 2013-02-08 MED ORDER — CEFAZOLIN SODIUM-DEXTROSE 2-3 GM-% IV SOLR
2.0000 g | INTRAVENOUS | Status: AC
  Administered 2013-02-08: 2 g via INTRAVENOUS

## 2013-02-08 MED ORDER — DEXAMETHASONE SODIUM PHOSPHATE 4 MG/ML IJ SOLN
INTRAMUSCULAR | Status: DC | PRN
  Administered 2013-02-08: 5 mg via INTRAVENOUS

## 2013-02-08 MED ORDER — FENTANYL CITRATE 0.05 MG/ML IJ SOLN
INTRAMUSCULAR | Status: DC | PRN
  Administered 2013-02-08: 50 ug via INTRAVENOUS

## 2013-02-08 MED ORDER — OXYCODONE HCL 5 MG PO TABS: 5.0000 mg | ORAL_TABLET | Freq: Once | ORAL | Status: DC | PRN

## 2013-02-08 SURGICAL SUPPLY — 77 items
APL SKNCLS STERI-STRIP NONHPOA (GAUZE/BANDAGES/DRESSINGS) ×1
BANDAGE CONFORM 3  STR LF (GAUZE/BANDAGES/DRESSINGS) IMPLANT
BANDAGE ELASTIC 4 VELCRO ST LF (GAUZE/BANDAGES/DRESSINGS) ×4 IMPLANT
BENZOIN TINCTURE PRP APPL 2/3 (GAUZE/BANDAGES/DRESSINGS) ×1 IMPLANT
BLADE CUDA GRT WHITE 3.5 (BLADE) IMPLANT
BLADE CUDA SHAVER 3.5 (BLADE) IMPLANT
BLADE CUTTER GATOR 3.5 (BLADE) IMPLANT
BLADE GREAT WHITE 4.2 (BLADE) IMPLANT
BNDG CMPR 9X4 STRL LF SNTH (GAUZE/BANDAGES/DRESSINGS) ×1
BNDG ESMARK 4X9 LF (GAUZE/BANDAGES/DRESSINGS) ×1 IMPLANT
BUR CUDA 2.9 (BURR) IMPLANT
BUR FULL RADIUS 2.9 (BURR) ×1 IMPLANT
BUR GATOR 2.9 (BURR) IMPLANT
BUR OVAL 4.0 (BURR) IMPLANT
CANISTER SUCTION 1200CC (MISCELLANEOUS) ×1 IMPLANT
CLOTH BEACON ORANGE TIMEOUT ST (SAFETY) ×2 IMPLANT
CUFF TOURNIQUET SINGLE 18IN (TOURNIQUET CUFF) ×1 IMPLANT
CUTTER MENISCUS  4.2MM (BLADE)
CUTTER MENISCUS 4.2MM (BLADE) IMPLANT
DECANTER SPIKE VIAL GLASS SM (MISCELLANEOUS) IMPLANT
DRAPE ARTHROSCOPY W/POUCH 90 (DRAPES) ×2 IMPLANT
DRAPE OEC MINIVIEW 54X84 (DRAPES) IMPLANT
DRAPE SURG 17X23 STRL (DRAPES) ×2 IMPLANT
DURAPREP 26ML APPLICATOR (WOUND CARE) ×2 IMPLANT
ELECT REM PT RETURN 9FT ADLT (ELECTROSURGICAL) ×2
ELECTRODE REM PT RTRN 9FT ADLT (ELECTROSURGICAL) IMPLANT
GAUZE XEROFORM 1X8 LF (GAUZE/BANDAGES/DRESSINGS) IMPLANT
GLOVE BIO SURGEON STRL SZ7 (GLOVE) ×1 IMPLANT
GLOVE BIOGEL PI IND STRL 6.5 (GLOVE) IMPLANT
GLOVE BIOGEL PI IND STRL 7.0 (GLOVE) IMPLANT
GLOVE BIOGEL PI IND STRL 7.5 (GLOVE) IMPLANT
GLOVE BIOGEL PI INDICATOR 6.5 (GLOVE) ×1
GLOVE BIOGEL PI INDICATOR 7.0 (GLOVE) ×1
GLOVE BIOGEL PI INDICATOR 7.5 (GLOVE) ×1
GLOVE ECLIPSE 6.5 STRL STRAW (GLOVE) ×1 IMPLANT
GLOVE ORTHO TXT STRL SZ7.5 (GLOVE) ×3 IMPLANT
GOWN BRE IMP PREV XXLGXLNG (GOWN DISPOSABLE) ×1 IMPLANT
GOWN PREVENTION PLUS XLARGE (GOWN DISPOSABLE) ×4 IMPLANT
GOWN PREVENTION PLUS XXLARGE (GOWN DISPOSABLE) ×1 IMPLANT
IV NS IRRIG 3000ML ARTHROMATIC (IV SOLUTION) ×5 IMPLANT
K-WIRE .045X4 (WIRE) ×1 IMPLANT
KIT SHOULDER TRACTION (DRAPES) ×1 IMPLANT
NDL HYPO 25X1 1.5 SAFETY (NEEDLE) IMPLANT
NDL SAFETY ECLIPSE 18X1.5 (NEEDLE) IMPLANT
NEEDLE HYPO 18GX1.5 SHARP (NEEDLE)
NEEDLE HYPO 25X1 1.5 SAFETY (NEEDLE) ×2 IMPLANT
NS IRRIG 1000ML POUR BTL (IV SOLUTION) IMPLANT
PACK ARTHROSCOPY DSU (CUSTOM PROCEDURE TRAY) ×2 IMPLANT
PACK BASIN DAY SURGERY FS (CUSTOM PROCEDURE TRAY) ×2 IMPLANT
PAD CAST 4YDX4 CTTN HI CHSV (CAST SUPPLIES) ×3 IMPLANT
PADDING CAST ABS 4INX4YD NS (CAST SUPPLIES) ×1
PADDING CAST ABS COTTON 4X4 ST (CAST SUPPLIES) ×1 IMPLANT
PADDING CAST COTTON 4X4 STRL (CAST SUPPLIES) ×4
PENCIL BUTTON HOLSTER BLD 10FT (ELECTRODE) ×1 IMPLANT
SET ARTHROSCOPY TUBING (MISCELLANEOUS) ×2
SET ARTHROSCOPY TUBING LN (MISCELLANEOUS) ×1 IMPLANT
SHEET MEDIUM DRAPE 40X70 STRL (DRAPES) ×1 IMPLANT
SLEEVE SCD COMPRESS KNEE MED (MISCELLANEOUS) ×1 IMPLANT
SLING ARM FOAM STRAP LRG (SOFTGOODS) ×1 IMPLANT
SLING ARM FOAM STRAP MED (SOFTGOODS) IMPLANT
SPLINT FAST PLASTER 5X30 (CAST SUPPLIES) ×10
SPLINT PLASTER CAST FAST 5X30 (CAST SUPPLIES) IMPLANT
SPONGE GAUZE 4X4 12PLY (GAUZE/BANDAGES/DRESSINGS) ×3 IMPLANT
STRIP CLOSURE SKIN 1/2X4 (GAUZE/BANDAGES/DRESSINGS) ×1 IMPLANT
SUCTION FRAZIER TIP 10 FR DISP (SUCTIONS) ×1 IMPLANT
SUT ETHILON 3 0 PS 1 (SUTURE) IMPLANT
SUT MNCRL AB 4-0 PS2 18 (SUTURE) ×1 IMPLANT
SUT VIC AB 0 CT1 27 (SUTURE) ×2
SUT VIC AB 0 CT1 27XBRD ANBCTR (SUTURE) IMPLANT
SUT VIC AB 2-0 PS2 27 (SUTURE) IMPLANT
SUT VIC AB 2-0 SH 27 (SUTURE) ×2
SUT VIC AB 2-0 SH 27XBRD (SUTURE) ×1 IMPLANT
SYR CONTROL 10ML LL (SYRINGE) IMPLANT
TOWEL OR 17X24 6PK STRL BLUE (TOWEL DISPOSABLE) ×2 IMPLANT
TUBE CONNECTING 20X1/4 (TUBING) ×1 IMPLANT
UNDERPAD 30X30 INCONTINENT (UNDERPADS AND DIAPERS) ×2 IMPLANT
WATER STERILE IRR 1000ML POUR (IV SOLUTION) ×2 IMPLANT

## 2013-02-08 NOTE — Anesthesia Postprocedure Evaluation (Signed)
Anesthesia Post Note  Patient: Erin Arnold  Procedure(s) Performed: Procedure(s) (LRB): LEFT ELBOW ARTHROSCOPY WITH DEBRIDEMENT LIMITED/ TENNIS ELBOW  REPAIR, PARTIAL SYNOVECTOMY AND CHONDROPLASTY (Left)  Anesthesia type: General  Patient location: PACU  Post pain: Pain level controlled  Post assessment: Patient's Cardiovascular Status Stable  Last Vitals:  Filed Vitals:   02/08/13 1100  BP: 110/63  Pulse: 73  Temp:   Resp: 15    Post vital signs: Reviewed and stable  Level of consciousness: alert  Complications: No apparent anesthesia complications

## 2013-02-08 NOTE — Anesthesia Preprocedure Evaluation (Signed)
Anesthesia Evaluation  Patient identified by MRN, date of birth, ID band Patient awake    Reviewed: Allergy & Precautions, H&P , NPO status , Patient's Chart, lab work & pertinent test results, reviewed documented beta blocker date and time   Airway Mallampati: II TM Distance: >3 FB Neck ROM: full    Dental   Pulmonary neg pulmonary ROS,  breath sounds clear to auscultation        Cardiovascular negative cardio ROS  Rhythm:regular     Neuro/Psych PSYCHIATRIC DISORDERS negative neurological ROS     GI/Hepatic negative GI ROS, Neg liver ROS,   Endo/Other  negative endocrine ROS  Renal/GU negative Renal ROS  negative genitourinary   Musculoskeletal   Abdominal   Peds  Hematology negative hematology ROS (+)   Anesthesia Other Findings See surgeon's H&P   Reproductive/Obstetrics negative OB ROS                           Anesthesia Physical Anesthesia Plan  ASA: II  Anesthesia Plan: General   Post-op Pain Management:    Induction: Intravenous  Airway Management Planned: LMA and Oral ETT  Additional Equipment:   Intra-op Plan:   Post-operative Plan: Extubation in OR  Informed Consent: I have reviewed the patients History and Physical, chart, labs and discussed the procedure including the risks, benefits and alternatives for the proposed anesthesia with the patient or authorized representative who has indicated his/her understanding and acceptance.   Dental Advisory Given  Plan Discussed with: CRNA and Surgeon  Anesthesia Plan Comments:         Anesthesia Quick Evaluation

## 2013-02-08 NOTE — Anesthesia Procedure Notes (Signed)
Procedure Name: LMA Insertion Performed by: Vanessa Kampf W Pre-anesthesia Checklist: Patient identified, Timeout performed, Emergency Drugs available, Suction available and Patient being monitored Patient Re-evaluated:Patient Re-evaluated prior to inductionOxygen Delivery Method: Circle system utilized Preoxygenation: Pre-oxygenation with 100% oxygen Intubation Type: IV induction Ventilation: Mask ventilation without difficulty LMA: LMA inserted LMA Size: 4.0 Number of attempts: 1 Placement Confirmation: breath sounds checked- equal and bilateral and positive ETCO2 Tube secured with: Tape Dental Injury: Teeth and Oropharynx as per pre-operative assessment      

## 2013-02-08 NOTE — Transfer of Care (Signed)
Immediate Anesthesia Transfer of Care Note  Patient: Erin Arnold  Procedure(s) Performed: Procedure(s): LEFT ELBOW ARTHROSCOPY WITH DEBRIDEMENT LIMITED/ TENNIS ELBOW  REPAIR, PARTIAL SYNOVECTOMY AND CHONDROPLASTY (Left)  Patient Location: PACU  Anesthesia Type:General  Level of Consciousness: awake and alert   Airway & Oxygen Therapy: Patient Spontanous Breathing and Patient connected to face mask oxygen  Post-op Assessment: Report given to PACU RN and Post -op Vital signs reviewed and stable  Post vital signs: Reviewed and stable  Complications: No apparent anesthesia complications

## 2013-02-08 NOTE — Interval H&P Note (Signed)
History and Physical Interval Note:  02/08/2013 7:32 AM  Erin Arnold  has presented today for surgery, with the diagnosis of Left Elbow/Loose Body  The various methods of treatment have been discussed with the patient and family. After consideration of risks, benefits and other options for treatment, the patient has consented to  Procedure(s): LEFT ELBOW ARTHROSCOPY WITH DEBRIDEMENT LIMITED/ TENNIS ELBOW BONE REPAIR (Left) as a surgical intervention .  The patient's history has been reviewed, patient examined, no change in status, stable for surgery.  I have reviewed the patient's chart and labs.  Questions were answered to the patient's satisfaction.     Kamal Jurgens F

## 2013-02-09 ENCOUNTER — Encounter (HOSPITAL_BASED_OUTPATIENT_CLINIC_OR_DEPARTMENT_OTHER): Payer: Self-pay | Admitting: Orthopedic Surgery

## 2013-02-09 NOTE — Op Note (Signed)
NAME:  Erin Arnold, Erin Arnold NO.:  [id number]  MEDICAL RECORD NO.:  [id number]  LOCATION:                                 FACILITY:  PHYSICIAN:  Loreta Ave, M.D.      DATE OF BIRTH:  DATE OF PROCEDURE:  02/08/2013 DATE OF DISCHARGE:                              OPERATIVE REPORT   PREOPERATIVE DIAGNOSIS:  Left elbow marked lateral epicondylitis with tearing ECRB tendon.  Intra-articular synovitis, chondromalacia question loose body.  POSTOPERATIVE DIAGNOSIS:  Left elbow marked lateral epicondylitis with tearing ECRB tendon.  Intra-articular synovitis, chondromalacia question loose body with some focal grade 2 and 3 chondromalacia radial head reactive synovitis.  No loose bodies.  Extensive tearing ECRB tendon. Tendinopathy of superficial extensors.  PROCEDURE:  Left elbow exam under anesthesia, arthroscopy.  Lateral exploration with debridement ECRB tendon and lateral capsule. Debridement and drilling of epicondyle.  Arthroscopy with partial synovectomy and chondroplasty.  SURGEON:  Loreta Ave, M.D.  ASSISTANT:  Skip Mayer, PA, present throughout the entire case and necessary for timely completion of procedure.  ANESTHESIA:  General.  BLOOD LOSS:  Minimal.  SPECIMENS:  None.  CULTURES:  None.  COMPLICATION:  None.  DRESSINGS:  Soft compressive long-arm splint.  TOURNIQUET TIME:  1 hour.  DESCRIPTION OF PROCEDURE:  The patient was brought to operating room, placed on the operating table in supine position.  After adequate anesthesia had been obtained, tourniquet applied.  Prepped and draped in usual sterile fashion.  Exsanguinated with elevation of Esmarch, tourniquet inflated to 250 mmHg.  Full motion, stable elbow.  Incision from the lateral epicondyle distally.  Skin and subcutaneous tissue divided.  Superficial extensors intact.  Divided longitudinally. Extensive mucinous degeneration tearing ECRB tendon, lateral capsule. All these  debrided almost all way down the radial head.  Epicondyle debrided.  Treated multiple drilling.  Although, there were some changes of the superficial extensors.  These were still longitudinally intact. Utilizing the lateral incision, I was able to visualize the entire elbow including the posterior recess.  Other than some focal grade 3 chondromalacia on the radial head, which was debrided and the chondral debris removed as well as some synovitis in the anterior compartment. The rest of the elbow really looked quite good.  No loose bodies.  No evidence of posterior impingement.  No other areas of chondromalacia. No evidence of instability.  Instruments were removed.  Wound thoroughly irrigated.  Defect laterally was then closed with 0 Vicryl.  Superficial extensors over the defect and area of debridement.  The remaining wound closed subcutaneous subcuticular Vicryl.  Margins were injected with Marcaine.  Sterile compressive dressing applied.  Tourniquet deflated and removed.  Long-arm splint applied.  Anesthesia reversed.  Brought to the recovery room.  Tolerated the surgery well and no complications.     Loreta Ave, M.D.     DFM/MEDQ  D:  02/08/2013  T:  02/08/2013  Job:  [id number]

## 2013-05-17 ENCOUNTER — Other Ambulatory Visit: Payer: Self-pay

## 2013-05-17 DIAGNOSIS — I10 Essential (primary) hypertension: Secondary | ICD-10-CM

## 2013-05-17 MED ORDER — METOPROLOL SUCCINATE 12.5 MG HALF TABLET: 12.5000 mg | ORAL_TABLET | Freq: Every day | ORAL | Status: DC

## 2013-05-26 DIAGNOSIS — G5601 Carpal tunnel syndrome, right upper limb: Secondary | ICD-10-CM

## 2013-05-26 HISTORY — DX: Carpal tunnel syndrome, right upper limb: G56.01

## 2013-05-31 ENCOUNTER — Other Ambulatory Visit: Payer: Self-pay

## 2013-05-31 DIAGNOSIS — I10 Essential (primary) hypertension: Secondary | ICD-10-CM

## 2013-05-31 MED ORDER — METOPROLOL SUCCINATE 12.5 MG HALF TABLET: 12.5000 mg | ORAL_TABLET | Freq: Every day | ORAL | Status: DC

## 2013-05-31 MED ORDER — METOPROLOL SUCCINATE ER 25 MG PO TB24: 25.0000 mg | ORAL_TABLET | Freq: Every day | ORAL | Status: DC

## 2013-06-11 ENCOUNTER — Other Ambulatory Visit: Payer: Self-pay | Admitting: Orthopedic Surgery

## 2013-06-11 ENCOUNTER — Encounter (HOSPITAL_BASED_OUTPATIENT_CLINIC_OR_DEPARTMENT_OTHER): Payer: Self-pay | Admitting: *Deleted

## 2013-06-11 NOTE — Pre-Procedure Instructions (Signed)
To come for BMET 

## 2013-06-13 ENCOUNTER — Encounter (HOSPITAL_BASED_OUTPATIENT_CLINIC_OR_DEPARTMENT_OTHER)
Admission: RE | Admit: 2013-06-13 | Discharge: 2013-06-13 | Disposition: A | Discharge status: Home / Self Care | Payer: BC Managed Care – PPO | Source: Ambulatory Visit | Attending: Orthopedic Surgery | Admitting: Orthopedic Surgery

## 2013-06-13 ENCOUNTER — Other Ambulatory Visit: Payer: Self-pay | Admitting: Orthopedic Surgery

## 2013-06-13 LAB — BASIC METABOLIC PANEL
BUN: 11 mg/dL (ref 6–23)
CO2: 30 mEq/L (ref 19–32)
Calcium: 9 mg/dL (ref 8.4–10.5)
Chloride: 101 mEq/L (ref 96–112)
Creatinine, Ser: 0.94 mg/dL (ref 0.50–1.10)
GFR calc Af Amer: 77 mL/min — ABNORMAL LOW (ref >90)
GFR calc non Af Amer: 67 mL/min — ABNORMAL LOW (ref >90)
Glucose, Bld: 97 mg/dL (ref 70–99)
Potassium: 3.6 mEq/L (ref 3.5–5.1)
Sodium: 140 mEq/L (ref 135–145)

## 2013-06-13 NOTE — H&P (Signed)
Gotham Raden/[facility] [address]   [address] [place], [place] [address] [phone] A Division of [facility]  Loreta Ave, M.D.   Robert A. Thurston Hole, M.D.   Burnell Blanks, M.D.   Eulas Post, M.D.   Lunette Stands, M.D Jewel Baize. Eulah Pont, M.D.  Buford Dresser, M.D.  Charlsie Quest, M.D.  Estell Harpin, M.D.   Melina Fiddler, M.D. Danford Bad. Willa Rough, PA-C  Kirstin A. Shepperson, PA-C  Josh Marked Tree, PA-C [place], [place]   RE: [place], [place]   [phone]      DOB: [birth year] PROGRESS NOTE: 05-25-13 Erin Arnold comes in for follow-up.  This is for her right hand.  She has had symptoms off and on fairly significant for 2-3 months and on questioning perhaps a bit longer.  Numbness, tingling, pain in the median nerve distribution.  This has progressed to a point that it wakes her up numerous times during the night.  She has not noted a lot of weakness.  Although I have seen and treated her for other issues, she has not brought up the problem of her carpal tunnel symptoms on the right with me in the past.    Her history and general exam is outlined included in the chart. Of note, left elbow previous operative treatment with radial tunnel decompression, tennis elbow procedure and arthroscopic dbridement really doing well.  Most recent procedure was 02/08/2013.  She has regained full motion.  Little of those symptoms and is very happy with the results.    EXAMINATION: On her exam today, on the right positive Tinel's, positive Phalen's all consistent with carpal tunnel syndrome.  Not a lot of atrophy but a little bit of give way weakness.  Good capillary refill.    IMPRESSION & DISPOSITION: Progressive worsening carpal tunnel syndrome on the right.  She is going to start using night splints on a regular basis and I have given her this to use.  Symptomatic are dramatic enough that I want to go ahead and pursue EMG and nerve conduction  studies.  We have talked about carpal tunnel release depending on her response to the splints and results of her EMG and NCV's.  I will be in touch over the phone after I get the results of her studies.  She will let me know if things change in the interim.  The procedure of carpal tunnel release has been discussed at length, paperwork completed and all questions answered.  But again a final decision about proceeding once is see her EMG's.     Loreta Ave, M.D.  Electronically verified by Loreta Ave, M.D. DFM:gde D 05-25-13 T 05-28-13

## 2013-06-14 ENCOUNTER — Encounter (HOSPITAL_BASED_OUTPATIENT_CLINIC_OR_DEPARTMENT_OTHER): Payer: Self-pay | Admitting: *Deleted

## 2013-06-14 ENCOUNTER — Other Ambulatory Visit: Payer: Self-pay | Admitting: Orthopedic Surgery

## 2013-06-14 ENCOUNTER — Ambulatory Visit (HOSPITAL_BASED_OUTPATIENT_CLINIC_OR_DEPARTMENT_OTHER)
Admission: RE | Admit: 2013-06-14 | Discharge: 2013-06-14 | Disposition: A | Discharge status: Home / Self Care | Payer: BC Managed Care – PPO | Source: Ambulatory Visit | Attending: Orthopedic Surgery | Admitting: Orthopedic Surgery

## 2013-06-14 ENCOUNTER — Ambulatory Visit (HOSPITAL_BASED_OUTPATIENT_CLINIC_OR_DEPARTMENT_OTHER): Payer: BC Managed Care – PPO | Admitting: Anesthesiology

## 2013-06-14 ENCOUNTER — Encounter (HOSPITAL_BASED_OUTPATIENT_CLINIC_OR_DEPARTMENT_OTHER)
Admission: RE | Disposition: A | Discharge status: Home / Self Care | Payer: Self-pay | Source: Ambulatory Visit | Attending: Orthopedic Surgery

## 2013-06-14 ENCOUNTER — Encounter (HOSPITAL_BASED_OUTPATIENT_CLINIC_OR_DEPARTMENT_OTHER): Payer: BC Managed Care – PPO | Admitting: Anesthesiology

## 2013-06-14 DIAGNOSIS — G5601 Carpal tunnel syndrome, right upper limb: Secondary | ICD-10-CM

## 2013-06-14 DIAGNOSIS — G5600 Carpal tunnel syndrome, unspecified upper limb: Secondary | ICD-10-CM | POA: Insufficient documentation

## 2013-06-14 DIAGNOSIS — K219 Gastro-esophageal reflux disease without esophagitis: Secondary | ICD-10-CM | POA: Insufficient documentation

## 2013-06-14 DIAGNOSIS — Z87891 Personal history of nicotine dependence: Secondary | ICD-10-CM | POA: Insufficient documentation

## 2013-06-14 HISTORY — PX: CARPAL TUNNEL RELEASE: SHX101

## 2013-06-14 HISTORY — DX: Carpal tunnel syndrome, right upper limb: G56.01

## 2013-06-14 HISTORY — DX: Gastro-esophageal reflux disease without esophagitis: K21.9

## 2013-06-14 LAB — POCT HEMOGLOBIN-HEMACUE: Hemoglobin: 14.3 g/dL (ref 12.0–15.0)

## 2013-06-14 SURGERY — CARPAL TUNNEL RELEASE
Anesthesia: General | Site: Hand | Laterality: Right | Wound class: Clean

## 2013-06-14 MED ORDER — CEFAZOLIN SODIUM-DEXTROSE 2-3 GM-% IV SOLR
2.0000 g | INTRAVENOUS | Status: AC
  Administered 2013-06-14: 2 g via INTRAVENOUS

## 2013-06-14 MED ORDER — KETOROLAC TROMETHAMINE 30 MG/ML IJ SOLN
INTRAMUSCULAR | Status: DC | PRN
  Administered 2013-06-14: 30 mg via INTRAVENOUS

## 2013-06-14 MED ORDER — HYDROCODONE-ACETAMINOPHEN 10-325 MG PO TABS: 1.0000 | ORAL_TABLET | Freq: Four times a day (QID) | ORAL | Status: DC | PRN

## 2013-06-14 MED ORDER — ONDANSETRON HCL 4 MG/2ML IJ SOLN
INTRAMUSCULAR | Status: DC | PRN
  Administered 2013-06-14: 4 mg via INTRAVENOUS

## 2013-06-14 MED ORDER — MIDAZOLAM HCL 5 MG/5ML IJ SOLN
INTRAMUSCULAR | Status: DC | PRN
  Administered 2013-06-14: 2 mg via INTRAVENOUS

## 2013-06-14 MED ORDER — MIDAZOLAM HCL 2 MG/2ML IJ SOLN: 1.0000 mg | INTRAMUSCULAR | Status: DC | PRN

## 2013-06-14 MED ORDER — PROPOFOL 10 MG/ML IV BOLUS
INTRAVENOUS | Status: DC | PRN
  Administered 2013-06-14: 150 mg via INTRAVENOUS

## 2013-06-14 MED ORDER — LACTATED RINGERS IV SOLN
INTRAVENOUS | Status: DC
  Administered 2013-06-14: 07:00:00 via INTRAVENOUS

## 2013-06-14 MED ORDER — ONDANSETRON HCL 4 MG/2ML IJ SOLN: 4.0000 mg | Freq: Four times a day (QID) | INTRAMUSCULAR | Status: DC | PRN

## 2013-06-14 MED ORDER — BUPIVACAINE HCL (PF) 0.5 % IJ SOLN
INTRAMUSCULAR | Status: AC
  Filled 2013-06-14: qty 30

## 2013-06-14 MED ORDER — FENTANYL CITRATE 0.05 MG/ML IJ SOLN
INTRAMUSCULAR | Status: DC | PRN
  Administered 2013-06-14: 50 ug via INTRAVENOUS
  Administered 2013-06-14: 25 ug via INTRAVENOUS

## 2013-06-14 MED ORDER — FENTANYL CITRATE 0.05 MG/ML IJ SOLN: 50.0000 ug | INTRAMUSCULAR | Status: DC | PRN

## 2013-06-14 MED ORDER — BUPIVACAINE HCL (PF) 0.5 % IJ SOLN
INTRAMUSCULAR | Status: DC | PRN
  Administered 2013-06-14: 2.5 mL

## 2013-06-14 MED ORDER — FENTANYL CITRATE 0.05 MG/ML IJ SOLN
INTRAMUSCULAR | Status: AC
  Filled 2013-06-14: qty 2

## 2013-06-14 MED ORDER — MIDAZOLAM HCL 2 MG/2ML IJ SOLN
INTRAMUSCULAR | Status: AC
  Filled 2013-06-14: qty 2

## 2013-06-14 MED ORDER — FENTANYL CITRATE 0.05 MG/ML IJ SOLN
25.0000 ug | INTRAMUSCULAR | Status: DC | PRN
  Administered 2013-06-14: 25 ug via INTRAVENOUS

## 2013-06-14 MED ORDER — OXYCODONE HCL 5 MG/5ML PO SOLN: 5.0000 mg | Freq: Once | ORAL | Status: AC | PRN

## 2013-06-14 MED ORDER — OXYCODONE HCL 5 MG PO TABS
ORAL_TABLET | ORAL | Status: AC
  Filled 2013-06-14: qty 1

## 2013-06-14 MED ORDER — BUPIVACAINE HCL (PF) 0.25 % IJ SOLN
INTRAMUSCULAR | Status: AC
  Filled 2013-06-14: qty 30

## 2013-06-14 MED ORDER — LIDOCAINE HCL (CARDIAC) 20 MG/ML IV SOLN
INTRAVENOUS | Status: DC | PRN
  Administered 2013-06-14: 50 mg via INTRAVENOUS

## 2013-06-14 MED ORDER — CEFAZOLIN SODIUM-DEXTROSE 2-3 GM-% IV SOLR
INTRAVENOUS | Status: AC
  Filled 2013-06-14: qty 50

## 2013-06-14 MED ORDER — SODIUM CHLORIDE 0.9 % IV SOLN: INTRAVENOUS | Status: DC

## 2013-06-14 MED ORDER — MIDAZOLAM HCL 2 MG/ML PO SYRP: 12.0000 mg | ORAL_SOLUTION | Freq: Once | ORAL | Status: DC | PRN

## 2013-06-14 MED ORDER — CHLORHEXIDINE GLUCONATE 4 % EX LIQD: 60.0000 mL | Freq: Once | CUTANEOUS | Status: DC

## 2013-06-14 MED ORDER — DEXAMETHASONE SODIUM PHOSPHATE 4 MG/ML IJ SOLN
INTRAMUSCULAR | Status: DC | PRN
  Administered 2013-06-14: 10 mg via INTRAVENOUS

## 2013-06-14 MED ORDER — OXYCODONE HCL 5 MG PO TABS
5.0000 mg | ORAL_TABLET | Freq: Once | ORAL | Status: AC | PRN
  Administered 2013-06-14: 5 mg via ORAL

## 2013-06-14 SURGICAL SUPPLY — 49 items
BANDAGE ELASTIC 3 VELCRO ST LF (GAUZE/BANDAGES/DRESSINGS) ×2 IMPLANT
BLADE SURG 15 STRL LF DISP TIS (BLADE) ×1 IMPLANT
BLADE SURG 15 STRL SS (BLADE) ×2
BNDG CMPR 9X4 STRL LF SNTH (GAUZE/BANDAGES/DRESSINGS) ×1
BNDG COHESIVE 3X5 TAN STRL LF (GAUZE/BANDAGES/DRESSINGS) ×2 IMPLANT
BNDG ESMARK 4X9 LF (GAUZE/BANDAGES/DRESSINGS) ×1 IMPLANT
CHLORAPREP W/TINT 26ML (MISCELLANEOUS) ×1 IMPLANT
CORDS BIPOLAR (ELECTRODE) ×1 IMPLANT
COVER MAYO STAND STRL (DRAPES) ×2 IMPLANT
COVER TABLE BACK 60X90 (DRAPES) ×2 IMPLANT
CUFF TOURNIQUET SINGLE 18IN (TOURNIQUET CUFF) ×1 IMPLANT
DRAPE EXTREMITY T 121X128X90 (DRAPE) ×2 IMPLANT
DRAPE SURG 17X23 STRL (DRAPES) ×2 IMPLANT
DRSG PAD ABDOMINAL 8X10 ST (GAUZE/BANDAGES/DRESSINGS) ×2 IMPLANT
DURAPREP 26ML APPLICATOR (WOUND CARE) ×1 IMPLANT
GAUZE XEROFORM 1X8 LF (GAUZE/BANDAGES/DRESSINGS) ×2 IMPLANT
GLOVE BIO SURGEON STRL SZ 6.5 (GLOVE) ×1 IMPLANT
GLOVE BIO SURGEON STRL SZ7 (GLOVE) ×1 IMPLANT
GLOVE BIO SURGEON STRL SZ8 (GLOVE) ×1 IMPLANT
GLOVE BIOGEL PI IND STRL 7.0 (GLOVE) IMPLANT
GLOVE BIOGEL PI IND STRL 7.5 (GLOVE) IMPLANT
GLOVE BIOGEL PI IND STRL 8.5 (GLOVE) ×1 IMPLANT
GLOVE BIOGEL PI INDICATOR 7.0 (GLOVE) ×1
GLOVE BIOGEL PI INDICATOR 7.5 (GLOVE) ×1
GLOVE BIOGEL PI INDICATOR 8.5 (GLOVE)
GLOVE ORTHO TXT STRL SZ7.5 (GLOVE) ×3 IMPLANT
GOWN BRE IMP PREV XXLGXLNG (GOWN DISPOSABLE) ×1 IMPLANT
GOWN PREVENTION PLUS XLARGE (GOWN DISPOSABLE) ×3 IMPLANT
GOWN PREVENTION PLUS XXLARGE (GOWN DISPOSABLE) ×1 IMPLANT
NDL HYPO 25X1 1.5 SAFETY (NEEDLE) ×1 IMPLANT
NEEDLE HYPO 25X1 1.5 SAFETY (NEEDLE) ×2 IMPLANT
NS IRRIG 1000ML POUR BTL (IV SOLUTION) ×2 IMPLANT
PACK BASIN DAY SURGERY FS (CUSTOM PROCEDURE TRAY) ×2 IMPLANT
PAD CAST 3X4 CTTN HI CHSV (CAST SUPPLIES) ×2 IMPLANT
PADDING CAST ABS 3INX4YD NS (CAST SUPPLIES) ×2
PADDING CAST ABS 4INX4YD NS (CAST SUPPLIES)
PADDING CAST ABS COTTON 3X4 (CAST SUPPLIES) ×1 IMPLANT
PADDING CAST ABS COTTON 4X4 ST (CAST SUPPLIES) ×1 IMPLANT
PADDING CAST COTTON 3X4 STRL (CAST SUPPLIES) ×4
SPLINT FIBERGLASS 3X35 (CAST SUPPLIES) ×1 IMPLANT
SPLINT PLASTER CAST XFAST 3X15 (CAST SUPPLIES) IMPLANT
SPLINT PLASTER XTRA FASTSET 3X (CAST SUPPLIES) ×10
SPONGE GAUZE 4X4 12PLY (GAUZE/BANDAGES/DRESSINGS) ×2 IMPLANT
STOCKINETTE 4X48 STRL (DRAPES) ×2 IMPLANT
SUT ETHILON 3 0 PS 1 (SUTURE) ×2 IMPLANT
SYR BULB 3OZ (MISCELLANEOUS) ×2 IMPLANT
SYR CONTROL 10ML LL (SYRINGE) ×2 IMPLANT
TOWEL OR 17X24 6PK STRL BLUE (TOWEL DISPOSABLE) ×2 IMPLANT
UNDERPAD 30X30 INCONTINENT (UNDERPADS AND DIAPERS) ×2 IMPLANT

## 2013-06-14 NOTE — Anesthesia Procedure Notes (Signed)
Procedure Name: LMA Insertion Performed by: Mallory Enriques W Pre-anesthesia Checklist: Patient identified, Timeout performed, Emergency Drugs available, Suction available and Patient being monitored Patient Re-evaluated:Patient Re-evaluated prior to inductionOxygen Delivery Method: Circle system utilized Preoxygenation: Pre-oxygenation with 100% oxygen Intubation Type: IV induction Ventilation: Mask ventilation without difficulty LMA: LMA inserted LMA Size: 4.0 Number of attempts: 1 Placement Confirmation: breath sounds checked- equal and bilateral and positive ETCO2 Tube secured with: Tape Dental Injury: Teeth and Oropharynx as per pre-operative assessment      

## 2013-06-14 NOTE — Transfer of Care (Signed)
Immediate Anesthesia Transfer of Care Note  Patient: Erin Arnold  Procedure(s) Performed: Procedure(s): CARPAL TUNNEL RELEASE; RIGHT  (Right)  Patient Location: PACU  Anesthesia Type:General  Level of Consciousness: awake and sedated  Airway & Oxygen Therapy: Patient Spontanous Breathing and Patient connected to face mask oxygen  Post-op Assessment: Report given to PACU RN and Post -op Vital signs reviewed and stable  Post vital signs: Reviewed and stable  Complications: No apparent anesthesia complications

## 2013-06-14 NOTE — Anesthesia Postprocedure Evaluation (Signed)
Anesthesia Post Note  Patient: Erin Arnold  Procedure(s) Performed: Procedure(s) (LRB): CARPAL TUNNEL RELEASE; RIGHT  (Right)  Anesthesia type: General  Patient location: PACU  Post pain: Pain level controlled and Adequate analgesia  Post assessment: Post-op Vital signs reviewed, Patient's Cardiovascular Status Stable, Respiratory Function Stable, Patent Airway and Pain level controlled  Last Vitals:  Filed Vitals:   06/14/13 0830  BP: 113/71  Pulse: 77  Temp:   Resp: 13    Post vital signs: Reviewed and stable  Level of consciousness: awake, alert  and oriented  Complications: No apparent anesthesia complications

## 2013-06-14 NOTE — Anesthesia Preprocedure Evaluation (Signed)
Anesthesia Evaluation  Patient identified by MRN, date of birth, ID band Patient awake    Reviewed: Allergy & Precautions, H&P , NPO status , Patient's Chart, lab work & pertinent test results  Airway Mallampati: II  Neck ROM: full    Dental   Pulmonary former smoker,          Cardiovascular negative cardio ROS      Neuro/Psych  Headaches, Depression  Neuromuscular disease    GI/Hepatic GERD-  ,  Endo/Other    Renal/GU      Musculoskeletal   Abdominal   Peds  Hematology   Anesthesia Other Findings   Reproductive/Obstetrics                           Anesthesia Physical Anesthesia Plan  ASA: II  Anesthesia Plan: General   Post-op Pain Management:    Induction: Intravenous  Airway Management Planned: LMA  Additional Equipment:   Intra-op Plan:   Post-operative Plan:   Informed Consent: I have reviewed the patients History and Physical, chart, labs and discussed the procedure including the risks, benefits and alternatives for the proposed anesthesia with the patient or authorized representative who has indicated his/her understanding and acceptance.     Plan Discussed with: CRNA, Anesthesiologist and Surgeon  Anesthesia Plan Comments:         Anesthesia Quick Evaluation

## 2013-06-14 NOTE — Interval H&P Note (Signed)
History and Physical Interval Note:  06/14/2013 7:32 AM  Erin Arnold  has presented today for surgery, with the diagnosis of RIGHT CARPAL TUNNEL SYNDROME  The various methods of treatment have been discussed with the patient and family. After consideration of risks, benefits and other options for treatment, the patient has consented to  Procedure(s): CARPAL TUNNEL RELEASE; RIGHT  (Right) as a surgical intervention .  The patient's history has been reviewed, patient examined, no change in status, stable for surgery.  I have reviewed the patient's chart and labs.  Questions were answered to the patient's satisfaction.     [name]

## 2013-06-15 ENCOUNTER — Encounter (HOSPITAL_BASED_OUTPATIENT_CLINIC_OR_DEPARTMENT_OTHER): Payer: Self-pay | Admitting: Orthopedic Surgery

## 2013-06-15 NOTE — Op Note (Signed)
NAMEMarland Kitchen  Erin, Arnold NO.:  [id number]  MEDICAL RECORD NO.:  [id number]  LOCATION:                               FACILITY:  [facility]  PHYSICIAN:  Loreta Ave, M.D. DATE OF BIRTH:  1956/02/07  DATE OF PROCEDURE:  06/14/2013 DATE OF DISCHARGE:  06/14/2013                              OPERATIVE REPORT   PREOPERATIVE DIAGNOSIS:  Right carpal tunnel syndrome.  POSTOPERATIVE DIAGNOSIS:  Right carpal tunnel syndrome.  PROCEDURE:  Right carpal tunnel release.  SURGEON:  Loreta Ave, M.D.  ASSISTANT:  Arna Snipe, PA.  ANESTHESIA:  General.  BLOOD LOSS:  Minimal.  SPECIMENS:  None.  COUNTS:  None.  COMPLICATIONS:  None.  DRESSINGS:  Soft compressive bulky hand dressing splint.  TOURNIQUET TIME:  25 minutes.  DESCRIPTION OF PROCEDURE:  The patient was brought to the operating room, placed on the operating table in supine position.  After adequate anesthesia had been obtained, tourniquet applied.  Prepped and draped in usual sterile fashion.  Exsanguinated with elevation of Esmarch, tourniquet inflated to 250 mmHg.  A small longitudinal incision was made over the carpal canal.  Skin and subcutaneous tissue divided. Retinaculum over the carpal tunnel incised under direct visualization from the forearm fascia proximally, the palmar arch distally.  Digital branch and motor branch identified and protected.  Moderate hyperemia right in the middle of the nerve.  Wound irrigated.  Skin was closed with nylon.  Margins were injected with Marcaine.  Sterile compressive dressing applied.  Bulky hand dressing and splint. Tourniquet deflated and removed.  Anesthesia reversed.  Brought to the recovery room.  Tolerated the surgery well.  No complications.     Loreta Ave, M.D.     DFM/MEDQ  D:  06/14/2013  T:  06/14/2013  Job:  [id number]

## 2013-11-14 ENCOUNTER — Other Ambulatory Visit: Payer: Self-pay

## 2013-11-14 MED ORDER — METOPROLOL SUCCINATE ER 25 MG PO TB24: 25.0000 mg | ORAL_TABLET | Freq: Every day | ORAL | Status: DC

## 2014-11-22 ENCOUNTER — Other Ambulatory Visit: Payer: Self-pay

## 2014-11-22 MED ORDER — METOPROLOL SUCCINATE ER 25 MG PO TB24: 25.0000 mg | ORAL_TABLET | Freq: Every day | ORAL | Status: DC

## 2015-06-06 ENCOUNTER — Other Ambulatory Visit: Payer: Self-pay

## 2015-06-06 ENCOUNTER — Telehealth: Payer: Self-pay

## 2015-06-06 MED ORDER — METOPROLOL SUCCINATE ER 25 MG PO TB24: 25.0000 mg | ORAL_TABLET | Freq: Every day | ORAL | Status: DC

## 2015-06-06 NOTE — Telephone Encounter (Signed)
Pt called for metoprolol refills, she hasn't been seen since 2014 and is asymptomatic, you said follow up as needed.  Is it ok to refill and does she also need an appointment ??

## 2015-06-06 NOTE — Telephone Encounter (Signed)
She will need to see me ( or at least set up an appt. To see me soon)  OK to refill metoprolol for a month - I should be able to see her in the next week or so

## 2015-06-23 ENCOUNTER — Encounter: Payer: Self-pay | Admitting: Cardiovascular Disease

## 2015-06-23 ENCOUNTER — Ambulatory Visit (INDEPENDENT_AMBULATORY_CARE_PROVIDER_SITE_OTHER): Payer: 59 | Admitting: Cardiovascular Disease

## 2015-06-23 VITALS — BP 108/78 | HR 70 | Ht 68.0 in | Wt 169.8 lb

## 2015-06-23 DIAGNOSIS — R002 Palpitations: Secondary | ICD-10-CM

## 2015-06-23 DIAGNOSIS — E785 Hyperlipidemia, unspecified: Secondary | ICD-10-CM | POA: Diagnosis not present

## 2015-06-23 MED ORDER — METOPROLOL SUCCINATE ER 25 MG PO TB24: 12.5000 mg | ORAL_TABLET | Freq: Every day | ORAL | Status: DC

## 2015-06-23 NOTE — Patient Instructions (Signed)

## 2015-06-23 NOTE — Progress Notes (Signed)
Erin Arnold Date of Birth  1956/07/03 [facility]     [place] Office  [address]    [address]   [address] [place], [place]  [address]    [place], [place]  [address] [phone]  Fax  [phone]  [phone]  Fax [phone]  Problem List: 1. Palpitations 2. Hx of colitis - following a C.Diff infection  History of Present Illness:  Erin Arnold is a 59 yo with a history of palpitations. She's had these palpitations for several years. These are typically described as heart irregularities followed by very strong heartbeats. On one occasion she's had a prolonged episode of palpitations.  These typically occur at night when she's relaxing. They also occur during the day. She does  not have any specific association for these palpitations. These episodes are associated with syncope, presyncope, chest pain, or shortness breath.  She has not had any problems.  Her palpitations have been well controlled. She is scheduled to have arm surgery.    November 10, 2012:  Erin Arnold is doing well from a cardiac standpoint.  She still have occasional palps.   She has had left radial nerve decompression  06/23/2015:  Doing well.   Here to get her Toprol 12.5 mg  refilled.  No side effects.  Exercising regularly .    Current Outpatient Prescriptions on File Prior to Visit  Medication Sig Dispense Refill  . Ascorbic Acid (VITAMIN C PO) Take 1 tablet by mouth daily.     [name] CA-MG-VIT D-L METHYLFOL-B6-B12 PO Take 1 tablet by mouth daily.     [name] HYDROcodone-acetaminophen (NORCO) 10-325 MG per tablet Take 1 tablet by mouth every 6 (six) hours as needed. 40 tablet 0  . LYSINE PO Take 1 tablet by mouth daily.     . magnesium 30 MG tablet Take 30 mg by mouth daily.     . metoprolol succinate (TOPROL-XL) 25 MG 24 hr tablet Take 1 tablet (25 mg total) by mouth daily. 30 tablet 2  . Misc Natural Products (OSTEO BI-FLEX ADV JOINT SHIELD PO) Take 1 tablet by mouth daily.     . Multiple  Vitamins-Minerals (ZINC PO) Take 1 tablet by mouth daily.     . polyethylene glycol (MIRALAX / GLYCOLAX) packet Take 17 g by mouth daily.    . Probiotic Product (PROBIOTIC FORMULA PO) Take 1 tablet by mouth daily.     . sertraline (ZOLOFT) 50 MG tablet Take 25 mg by mouth daily.     . traZODone (DESYREL) 50 MG tablet Take 50 mg by mouth at bedtime as needed for sleep.     [name] zolpidem (AMBIEN) 10 MG tablet Take 10 mg by mouth at bedtime as needed for sleep.      No current facility-administered medications on file prior to visit.    No Known Allergies  Past Medical History  Diagnosis Date  . Seasonal allergies   . Constipation     occasional  . History of colitis   . History of palpitations     under control with Metoprolol  . PMDD (premenstrual dysphoric disorder)     takes HCTZ to control sx.  . Headache(784.0)     sinus  . GERD (gastroesophageal reflux disease)     OTC as needed  . Carpal tunnel syndrome of right wrist 05/2013    Past Surgical History  Procedure Laterality Date  . Knee arthroscopy w/ acl reconstruction Left 02/18/2011    x 2  . Wrist  surgery Left 05/26/2012    radial nerve decompression  . Elbow arthroscopy Left 02/08/2013    Procedure: LEFT ELBOW ARTHROSCOPY WITH DEBRIDEMENT LIMITED/ TENNIS ELBOW  REPAIR, PARTIAL SYNOVECTOMY AND CHONDROPLASTY;  Surgeon: Loreta Aveaniel F Murphy, MD;  Location: [facility];  Service: Orthopedics;  Laterality: Left;  . Carpal tunnel release Right 06/14/2013    Procedure: CARPAL TUNNEL RELEASE; RIGHT ;  Surgeon: Loreta Aveaniel F Murphy, MD;  Location: [facility];  Service: Orthopedics;  Laterality: Right;    History  Smoking status  . Former Smoker  . Quit date: 07/26/1976  Smokeless tobacco  . Never Used    History  Alcohol Use  . Yes    Comment: occasional beer    No family history on file.  Reviw of Systems:  Reviewed in the HPI.  All other systems are negative.  Physical Exam: Blood pressure  108/78, pulse 70, height 5\' 8"  (1.727 m), weight 169 lb 12.8 oz (77.021 kg). General: Well developed, well nourished, in no acute distress.  Head: Normocephalic, atraumatic, sclera non-icteric, mucus membranes are moist,   Neck: Supple. Carotids are 2 + without bruits. No JVD  Lungs: Clear bilaterally to auscultation.  Heart: regular rate.  normal  S1 S2. She has a 1-2 / 6 systolic murmur at the LSB.  No radiation.  Abdomen: Soft, non-tender, non-distended with normal bowel sounds. No hepatomegaly. No rebound/guarding. No masses.  Msk:  Strength and tone are normal  Extremities: No clubbing or cyanosis. No edema.  Distal pedal pulses are 2+ and equal bilaterally.  Neuro: Alert and oriented X 3. Moves all extremities spontaneously.  Psych:  Responds to questions appropriately with a normal affect.  ECG:  Nov. 28, 2016:  NSR at 70, normal ECG  Assessment / Plan:   1. Palpitations:  [name] is doing very well. Continue current dose of Toprol-XL. I'll see her again in one year.    Lindy Garczynski, Deloris PingPhilip J, MD  06/23/2015 2:07 PM    [facility] [address]  [address] [place], [place]  [address] Pager [phone]- [phone] Phone: [phone]; Fax: [phone]   [facility]  [address] [place], [place]  [address] [phone]) [phone]   Fax [phone]) [phone]

## 2015-09-02 ENCOUNTER — Encounter: Payer: Self-pay | Admitting: Cardiovascular Disease

## 2015-12-16 DIAGNOSIS — H903 Sensorineural hearing loss, bilateral: Secondary | ICD-10-CM | POA: Diagnosis not present

## 2015-12-16 DIAGNOSIS — H6981 Other specified disorders of Eustachian tube, right ear: Secondary | ICD-10-CM | POA: Diagnosis not present

## 2016-01-22 DIAGNOSIS — J069 Acute upper respiratory infection, unspecified: Secondary | ICD-10-CM | POA: Diagnosis not present

## 2016-03-12 DIAGNOSIS — Z23 Encounter for immunization: Secondary | ICD-10-CM | POA: Diagnosis not present

## 2016-05-11 DIAGNOSIS — Z124 Encounter for screening for malignant neoplasm of cervix: Secondary | ICD-10-CM | POA: Diagnosis not present

## 2016-05-11 DIAGNOSIS — Z1231 Encounter for screening mammogram for malignant neoplasm of breast: Secondary | ICD-10-CM | POA: Diagnosis not present

## 2016-05-11 DIAGNOSIS — Z6827 Body mass index (BMI) 27.0-27.9, adult: Secondary | ICD-10-CM | POA: Diagnosis not present

## 2016-05-11 DIAGNOSIS — Z01419 Encounter for gynecological examination (general) (routine) without abnormal findings: Secondary | ICD-10-CM | POA: Diagnosis not present

## 2016-07-02 ENCOUNTER — Other Ambulatory Visit: Payer: Self-pay | Admitting: Cardiovascular Disease

## 2016-08-20 ENCOUNTER — Ambulatory Visit
Admission: RE | Admit: 2016-08-20 | Discharge: 2016-08-20 | Disposition: A | Discharge status: Home / Self Care | Payer: BLUE CROSS/BLUE SHIELD | Source: Ambulatory Visit | Attending: Physical Medicine & Rehabilitation | Admitting: Physical Medicine & Rehabilitation

## 2016-08-20 ENCOUNTER — Other Ambulatory Visit: Payer: Self-pay | Admitting: Physical Medicine & Rehabilitation

## 2016-08-20 DIAGNOSIS — M25551 Pain in right hip: Secondary | ICD-10-CM | POA: Diagnosis not present

## 2016-08-20 DIAGNOSIS — M25552 Pain in left hip: Secondary | ICD-10-CM | POA: Diagnosis not present

## 2016-08-20 DIAGNOSIS — M25559 Pain in unspecified hip: Secondary | ICD-10-CM

## 2016-09-03 DIAGNOSIS — N289 Disorder of kidney and ureter, unspecified: Secondary | ICD-10-CM | POA: Diagnosis not present

## 2016-09-03 DIAGNOSIS — Z23 Encounter for immunization: Secondary | ICD-10-CM | POA: Diagnosis not present

## 2016-09-03 DIAGNOSIS — R002 Palpitations: Secondary | ICD-10-CM | POA: Diagnosis not present

## 2016-09-03 DIAGNOSIS — R454 Irritability and anger: Secondary | ICD-10-CM | POA: Diagnosis not present

## 2016-09-03 DIAGNOSIS — G479 Sleep disorder, unspecified: Secondary | ICD-10-CM | POA: Diagnosis not present

## 2016-09-09 ENCOUNTER — Other Ambulatory Visit: Payer: Self-pay | Admitting: Cardiovascular Disease

## 2017-05-13 DIAGNOSIS — Z6828 Body mass index (BMI) 28.0-28.9, adult: Secondary | ICD-10-CM | POA: Diagnosis not present

## 2017-05-13 DIAGNOSIS — Z1231 Encounter for screening mammogram for malignant neoplasm of breast: Secondary | ICD-10-CM | POA: Diagnosis not present

## 2017-05-13 DIAGNOSIS — Z124 Encounter for screening for malignant neoplasm of cervix: Secondary | ICD-10-CM | POA: Diagnosis not present

## 2017-05-13 DIAGNOSIS — Z01419 Encounter for gynecological examination (general) (routine) without abnormal findings: Secondary | ICD-10-CM | POA: Diagnosis not present

## 2017-09-09 DIAGNOSIS — R002 Palpitations: Secondary | ICD-10-CM | POA: Diagnosis not present

## 2017-09-09 DIAGNOSIS — Z13220 Encounter for screening for lipoid disorders: Secondary | ICD-10-CM | POA: Diagnosis not present

## 2017-09-09 DIAGNOSIS — R454 Irritability and anger: Secondary | ICD-10-CM | POA: Diagnosis not present

## 2017-09-09 DIAGNOSIS — N289 Disorder of kidney and ureter, unspecified: Secondary | ICD-10-CM | POA: Diagnosis not present

## 2017-09-09 DIAGNOSIS — G479 Sleep disorder, unspecified: Secondary | ICD-10-CM | POA: Diagnosis not present

## 2017-12-02 DIAGNOSIS — M20011 Mallet finger of right finger(s): Secondary | ICD-10-CM | POA: Diagnosis not present

## 2017-12-02 DIAGNOSIS — M25561 Pain in right knee: Secondary | ICD-10-CM | POA: Diagnosis not present

## 2017-12-15 DIAGNOSIS — M20011 Mallet finger of right finger(s): Secondary | ICD-10-CM | POA: Diagnosis not present

## 2017-12-22 DIAGNOSIS — M25561 Pain in right knee: Secondary | ICD-10-CM | POA: Diagnosis not present

## 2017-12-26 DIAGNOSIS — M25541 Pain in joints of right hand: Secondary | ICD-10-CM | POA: Diagnosis not present

## 2017-12-26 DIAGNOSIS — M20011 Mallet finger of right finger(s): Secondary | ICD-10-CM | POA: Diagnosis not present

## 2017-12-30 DIAGNOSIS — M1711 Unilateral primary osteoarthritis, right knee: Secondary | ICD-10-CM | POA: Diagnosis not present

## 2018-01-02 DIAGNOSIS — M20011 Mallet finger of right finger(s): Secondary | ICD-10-CM | POA: Diagnosis not present

## 2018-01-02 DIAGNOSIS — M25561 Pain in right knee: Secondary | ICD-10-CM | POA: Diagnosis not present

## 2018-01-02 DIAGNOSIS — M25541 Pain in joints of right hand: Secondary | ICD-10-CM | POA: Diagnosis not present

## 2018-01-05 DIAGNOSIS — K648 Other hemorrhoids: Secondary | ICD-10-CM | POA: Diagnosis not present

## 2018-01-05 DIAGNOSIS — Z1211 Encounter for screening for malignant neoplasm of colon: Secondary | ICD-10-CM | POA: Diagnosis not present

## 2018-01-19 DIAGNOSIS — M20011 Mallet finger of right finger(s): Secondary | ICD-10-CM | POA: Diagnosis not present

## 2018-01-23 DIAGNOSIS — M1711 Unilateral primary osteoarthritis, right knee: Secondary | ICD-10-CM | POA: Diagnosis not present

## 2018-02-01 DIAGNOSIS — K648 Other hemorrhoids: Secondary | ICD-10-CM | POA: Diagnosis not present

## 2018-02-06 DIAGNOSIS — M20011 Mallet finger of right finger(s): Secondary | ICD-10-CM | POA: Diagnosis not present

## 2018-02-06 DIAGNOSIS — M25541 Pain in joints of right hand: Secondary | ICD-10-CM | POA: Diagnosis not present

## 2018-02-23 DIAGNOSIS — M20011 Mallet finger of right finger(s): Secondary | ICD-10-CM | POA: Diagnosis not present

## 2018-03-10 DIAGNOSIS — M20011 Mallet finger of right finger(s): Secondary | ICD-10-CM | POA: Diagnosis not present

## 2018-03-21 DIAGNOSIS — M20011 Mallet finger of right finger(s): Secondary | ICD-10-CM | POA: Diagnosis not present

## 2018-03-21 DIAGNOSIS — S66398D Other injury of extensor muscle, fascia and tendon of other finger at wrist and hand level, subsequent encounter: Secondary | ICD-10-CM | POA: Diagnosis not present

## 2018-03-24 DIAGNOSIS — H903 Sensorineural hearing loss, bilateral: Secondary | ICD-10-CM | POA: Diagnosis not present

## 2018-03-24 DIAGNOSIS — H6983 Other specified disorders of Eustachian tube, bilateral: Secondary | ICD-10-CM | POA: Diagnosis not present

## 2018-03-30 DIAGNOSIS — M20011 Mallet finger of right finger(s): Secondary | ICD-10-CM | POA: Diagnosis not present

## 2018-03-31 DIAGNOSIS — K602 Anal fissure, unspecified: Secondary | ICD-10-CM | POA: Diagnosis not present

## 2018-03-31 DIAGNOSIS — H6983 Other specified disorders of Eustachian tube, bilateral: Secondary | ICD-10-CM | POA: Diagnosis not present

## 2018-03-31 DIAGNOSIS — K648 Other hemorrhoids: Secondary | ICD-10-CM | POA: Diagnosis not present

## 2018-04-12 DIAGNOSIS — H6981 Other specified disorders of Eustachian tube, right ear: Secondary | ICD-10-CM | POA: Diagnosis not present

## 2018-04-13 DIAGNOSIS — M20011 Mallet finger of right finger(s): Secondary | ICD-10-CM | POA: Diagnosis not present

## 2018-04-18 DIAGNOSIS — M9905 Segmental and somatic dysfunction of pelvic region: Secondary | ICD-10-CM | POA: Diagnosis not present

## 2018-04-18 DIAGNOSIS — M5116 Intervertebral disc disorders with radiculopathy, lumbar region: Secondary | ICD-10-CM | POA: Diagnosis not present

## 2018-04-18 DIAGNOSIS — M9914 Subluxation complex (vertebral) of sacral region: Secondary | ICD-10-CM | POA: Diagnosis not present

## 2018-04-18 DIAGNOSIS — M9903 Segmental and somatic dysfunction of lumbar region: Secondary | ICD-10-CM | POA: Diagnosis not present

## 2018-04-24 DIAGNOSIS — M2141 Flat foot [pes planus] (acquired), right foot: Secondary | ICD-10-CM | POA: Diagnosis not present

## 2018-04-24 DIAGNOSIS — M9903 Segmental and somatic dysfunction of lumbar region: Secondary | ICD-10-CM | POA: Diagnosis not present

## 2018-04-24 DIAGNOSIS — M5116 Intervertebral disc disorders with radiculopathy, lumbar region: Secondary | ICD-10-CM | POA: Diagnosis not present

## 2018-04-24 DIAGNOSIS — M2142 Flat foot [pes planus] (acquired), left foot: Secondary | ICD-10-CM | POA: Diagnosis not present

## 2018-04-27 DIAGNOSIS — M20011 Mallet finger of right finger(s): Secondary | ICD-10-CM | POA: Diagnosis not present

## 2018-04-28 ENCOUNTER — Encounter: Payer: Self-pay | Admitting: Cardiovascular Disease

## 2018-04-28 ENCOUNTER — Ambulatory Visit (INDEPENDENT_AMBULATORY_CARE_PROVIDER_SITE_OTHER): Payer: BLUE CROSS/BLUE SHIELD | Admitting: Cardiovascular Disease

## 2018-04-28 VITALS — BP 120/74 | HR 77 | Ht 68.0 in | Wt 183.0 lb

## 2018-04-28 DIAGNOSIS — J301 Allergic rhinitis due to pollen: Secondary | ICD-10-CM

## 2018-04-28 DIAGNOSIS — M2141 Flat foot [pes planus] (acquired), right foot: Secondary | ICD-10-CM | POA: Diagnosis not present

## 2018-04-28 DIAGNOSIS — M2142 Flat foot [pes planus] (acquired), left foot: Secondary | ICD-10-CM | POA: Diagnosis not present

## 2018-04-28 DIAGNOSIS — M9903 Segmental and somatic dysfunction of lumbar region: Secondary | ICD-10-CM | POA: Diagnosis not present

## 2018-04-28 DIAGNOSIS — R002 Palpitations: Secondary | ICD-10-CM

## 2018-04-28 DIAGNOSIS — M5116 Intervertebral disc disorders with radiculopathy, lumbar region: Secondary | ICD-10-CM | POA: Diagnosis not present

## 2018-04-28 MED ORDER — ALBUTEROL SULFATE HFA 108 (90 BASE) MCG/ACT IN AERS: 2.0000 | INHALATION_SPRAY | Freq: Four times a day (QID) | RESPIRATORY_TRACT | 2 refills | Status: AC | PRN

## 2018-04-28 NOTE — Patient Instructions (Signed)
Medication Instructions:  Your physician has recommended you make the following change in your medication:   USE Albuterol Inhaler 2 puffs every 6 hours as needed   If you need a refill on your cardiac medications before your next appointment, please call your pharmacy.   Lab work: None Ordered   Testing/Procedures: None Ordered  Follow-Up: At [facility], you and your health needs are our priority.  As part of our continuing mission to provide you with exceptional heart care, we have created designated Provider Care Teams.  These Care Teams include your primary Cardiologist (physician) and Advanced Practice Providers (APPs -  Physician Assistants and Nurse Practitioners) who all work together to provide you with the care you need, when you need it. You will need a follow up appointment in:  as needed.  You may see Dr. Elease Hashimoto  or one of the following Advanced Practice Providers on your designated Care Team: Tereso Newcomer, PA-C [place], [place] . Berton Bon, NP

## 2018-04-28 NOTE — Progress Notes (Signed)
Erin Arnold Date of Birth  10-Dec-1955 [facility]     [place] Office  [address]    [address]   [address] [place], [place]  [address]    [place], [place]  [address] [phone]  Fax  [phone]  [phone]  Fax [phone]  Problem List: 1. Palpitations 2. Hx of colitis - following a C.Diff infection    Erin Arnold is a 62 yo with a history of palpitations. She's had these palpitations for several years. These are typically described as heart irregularities followed by very strong heartbeats. On one occasion she's had a prolonged episode of palpitations.  These typically occur at night when she's relaxing. They also occur during the day. She does  not have any specific association for these palpitations. These episodes are associated with syncope, presyncope, chest pain, or shortness breath.  She has not had any problems.  Her palpitations have been well controlled. She is scheduled to have arm surgery.    November 10, 2012:  Erin Arnold is doing well from a cardiac standpoint.  She still have occasional palps.   She has had left radial nerve decompression  06/23/2015:  Doing well.   Here to get her Toprol 12.5 mg  refilled.  No side effects.  Exercising regularly .    April 28, 2018: Brein seen back today for further evaluation of her palpitations.  She is having some lots of orthopedic issues. Has brief episodes of palpitations.  These last for typically only 1 second.  No prolonged episodes of heartbeat irregularities. Not exercising regularly.  She is limited by her orthopedic issues. Has not been taking her Toprol  -    Current Outpatient Medications on File Prior to Visit  Medication Sig Dispense Refill  . Ascorbic Acid (VITAMIN C PO) Take 1 tablet by mouth daily.     [name] CA-MG-VIT D-L METHYLFOL-B6-B12 PO Take 1 tablet by mouth daily.     [name] FLUARIX QUADRIVALENT 0.5 ML injection Inject as directed once.  0  . HYDROcodone-acetaminophen (NORCO)  10-325 MG per tablet Take 1 tablet by mouth every 6 (six) hours as needed. 40 tablet 0  . LYSINE PO Take 1 tablet by mouth daily.     . magnesium 30 MG tablet Take 30 mg by mouth daily.     . Misc Natural Products (OSTEO BI-FLEX ADV JOINT SHIELD PO) Take 1 tablet by mouth daily.     . Multiple Vitamins-Minerals (ZINC PO) Take 1 tablet by mouth daily.     . polyethylene glycol (MIRALAX / GLYCOLAX) packet Take 17 g by mouth daily.    . Probiotic Product (PROBIOTIC FORMULA PO) Take 1 tablet by mouth daily.     . sertraline (ZOLOFT) 50 MG tablet Take 25 mg by mouth daily.     . traZODone (DESYREL) 50 MG tablet Take 50 mg by mouth at bedtime as needed for sleep.     . valACYclovir (VALTREX) 1000 MG tablet Take 1 g by mouth. TAKE 2 TABLETS TWICE DAILY BY MOUTH FOR 1 DAY THEN 1/2 TABLET BY MOUTH DAILY FOR SUPPRESSION  0  . zolpidem (AMBIEN) 10 MG tablet Take 10 mg by mouth at bedtime as needed for sleep.      No current facility-administered medications on file prior to visit.     No Known Allergies  Past Medical History:  Diagnosis Date  . Carpal tunnel syndrome of right wrist 05/2013  . Constipation    occasional  .  GERD (gastroesophageal reflux disease)    OTC as needed  . Headache(784.0)    sinus  . History of colitis   . History of palpitations    under control with Metoprolol  . PMDD (premenstrual dysphoric disorder)    takes HCTZ to control sx.  . Seasonal allergies     Past Surgical History:  Procedure Laterality Date  . CARPAL TUNNEL RELEASE Right 06/14/2013   Procedure: CARPAL TUNNEL RELEASE; RIGHT ;  Surgeon: Loreta Ave, MD;  Location: [facility];  Service: Orthopedics;  Laterality: Right;  . ELBOW ARTHROSCOPY Left 02/08/2013   Procedure: LEFT ELBOW ARTHROSCOPY WITH DEBRIDEMENT LIMITED/ TENNIS ELBOW  REPAIR, PARTIAL SYNOVECTOMY AND CHONDROPLASTY;  Surgeon: Loreta Ave, MD;  Location: [facility];  Service: Orthopedics;  Laterality: Left;   . KNEE ARTHROSCOPY W/ ACL RECONSTRUCTION Left 02/18/2011   x 2  . WRIST SURGERY Left 05/26/2012   radial nerve decompression    Social History   Tobacco Use  Smoking Status Former Smoker  . Last attempt to quit: 07/26/1976  . Years since quitting: 41.7  Smokeless Tobacco Never Used    Social History   Substance and Sexual Activity  Alcohol Use Yes   Comment: occasional beer    History reviewed. No pertinent family history.  Reviw of Systems:  Reviewed in the HPI.  All other systems are negative.  Physical Exam: Blood pressure 120/74, pulse 77, height 5\' 8"  (1.727 m), weight 183 lb (83 kg), SpO2 97 %.  GEN:  Well nourished, well developed in no acute distress HEENT: Normal NECK: No JVD; No carotid bruits LYMPHATICS: No lymphadenopathy CARDIAC: RR, no murmurs, rubs, gallops RESPIRATORY:  Clear to auscultation without rales, wheezing or rhonchi  ABDOMEN: Soft, non-tender, non-distended MUSCULOSKELETAL:  No edema; No deformity  SKIN: Warm and dry NEUROLOGIC:  Alert and oriented x 3    ECG: April 28, 2018: Normal sinus rhythm at 77.  No ST or T wave changes.  Assessment / Plan:   1. Palpitations:  Erin Arnold is doing very well. Continue current dose of Toprol-XL. I'll see her again in one year.  2.  History of wheezing: The patient has a history of wheezing especially in the fall.  I suspect that she has some seasonal bronchospasm.  We will give her a prescription for albuterol HFA to take 2 puffs every 6 hours as needed.   Erin Miss, MD  04/28/2018 9:29 AM    [facility] [address] [place],  [address] [place], [place]  [address] Pager [phone] Phone: [phone]; Fax: [phone]

## 2018-05-08 DIAGNOSIS — H6983 Other specified disorders of Eustachian tube, bilateral: Secondary | ICD-10-CM | POA: Diagnosis not present

## 2018-05-08 DIAGNOSIS — M20011 Mallet finger of right finger(s): Secondary | ICD-10-CM | POA: Diagnosis not present

## 2018-05-11 DIAGNOSIS — M2142 Flat foot [pes planus] (acquired), left foot: Secondary | ICD-10-CM | POA: Diagnosis not present

## 2018-05-11 DIAGNOSIS — M9903 Segmental and somatic dysfunction of lumbar region: Secondary | ICD-10-CM | POA: Diagnosis not present

## 2018-05-11 DIAGNOSIS — M2141 Flat foot [pes planus] (acquired), right foot: Secondary | ICD-10-CM | POA: Diagnosis not present

## 2018-05-11 DIAGNOSIS — M5116 Intervertebral disc disorders with radiculopathy, lumbar region: Secondary | ICD-10-CM | POA: Diagnosis not present

## 2018-05-11 DIAGNOSIS — M20011 Mallet finger of right finger(s): Secondary | ICD-10-CM | POA: Diagnosis not present

## 2018-05-12 DIAGNOSIS — M5116 Intervertebral disc disorders with radiculopathy, lumbar region: Secondary | ICD-10-CM | POA: Diagnosis not present

## 2018-05-12 DIAGNOSIS — M2142 Flat foot [pes planus] (acquired), left foot: Secondary | ICD-10-CM | POA: Diagnosis not present

## 2018-05-12 DIAGNOSIS — M2141 Flat foot [pes planus] (acquired), right foot: Secondary | ICD-10-CM | POA: Diagnosis not present

## 2018-05-12 DIAGNOSIS — M9903 Segmental and somatic dysfunction of lumbar region: Secondary | ICD-10-CM | POA: Diagnosis not present

## 2018-05-15 DIAGNOSIS — M25562 Pain in left knee: Secondary | ICD-10-CM | POA: Diagnosis not present

## 2018-05-15 DIAGNOSIS — M2141 Flat foot [pes planus] (acquired), right foot: Secondary | ICD-10-CM | POA: Diagnosis not present

## 2018-05-15 DIAGNOSIS — M20011 Mallet finger of right finger(s): Secondary | ICD-10-CM | POA: Diagnosis not present

## 2018-05-15 DIAGNOSIS — M5116 Intervertebral disc disorders with radiculopathy, lumbar region: Secondary | ICD-10-CM | POA: Diagnosis not present

## 2018-05-15 DIAGNOSIS — M9903 Segmental and somatic dysfunction of lumbar region: Secondary | ICD-10-CM | POA: Diagnosis not present

## 2018-05-15 DIAGNOSIS — M2142 Flat foot [pes planus] (acquired), left foot: Secondary | ICD-10-CM | POA: Diagnosis not present

## 2018-05-15 DIAGNOSIS — M25561 Pain in right knee: Secondary | ICD-10-CM | POA: Diagnosis not present

## 2018-05-16 DIAGNOSIS — M25561 Pain in right knee: Secondary | ICD-10-CM | POA: Diagnosis not present

## 2018-05-16 DIAGNOSIS — M5136 Other intervertebral disc degeneration, lumbar region: Secondary | ICD-10-CM | POA: Diagnosis not present

## 2018-05-16 DIAGNOSIS — M25562 Pain in left knee: Secondary | ICD-10-CM | POA: Diagnosis not present

## 2018-05-18 DIAGNOSIS — Z01419 Encounter for gynecological examination (general) (routine) without abnormal findings: Secondary | ICD-10-CM | POA: Diagnosis not present

## 2018-05-18 DIAGNOSIS — M25561 Pain in right knee: Secondary | ICD-10-CM | POA: Diagnosis not present

## 2018-05-18 DIAGNOSIS — M2142 Flat foot [pes planus] (acquired), left foot: Secondary | ICD-10-CM | POA: Diagnosis not present

## 2018-05-18 DIAGNOSIS — Z124 Encounter for screening for malignant neoplasm of cervix: Secondary | ICD-10-CM | POA: Diagnosis not present

## 2018-05-18 DIAGNOSIS — Z1231 Encounter for screening mammogram for malignant neoplasm of breast: Secondary | ICD-10-CM | POA: Diagnosis not present

## 2018-05-18 DIAGNOSIS — Z6828 Body mass index (BMI) 28.0-28.9, adult: Secondary | ICD-10-CM | POA: Diagnosis not present

## 2018-05-18 DIAGNOSIS — M2141 Flat foot [pes planus] (acquired), right foot: Secondary | ICD-10-CM | POA: Diagnosis not present

## 2018-05-18 DIAGNOSIS — M9903 Segmental and somatic dysfunction of lumbar region: Secondary | ICD-10-CM | POA: Diagnosis not present

## 2018-05-18 DIAGNOSIS — Z30432 Encounter for removal of intrauterine contraceptive device: Secondary | ICD-10-CM | POA: Diagnosis not present

## 2018-05-18 DIAGNOSIS — M5116 Intervertebral disc disorders with radiculopathy, lumbar region: Secondary | ICD-10-CM | POA: Diagnosis not present

## 2018-05-18 DIAGNOSIS — M25562 Pain in left knee: Secondary | ICD-10-CM | POA: Diagnosis not present

## 2018-05-19 DIAGNOSIS — K625 Hemorrhage of anus and rectum: Secondary | ICD-10-CM | POA: Diagnosis not present

## 2018-05-19 DIAGNOSIS — K601 Chronic anal fissure: Secondary | ICD-10-CM | POA: Diagnosis not present

## 2018-05-19 DIAGNOSIS — B001 Herpesviral vesicular dermatitis: Secondary | ICD-10-CM | POA: Diagnosis not present

## 2018-05-19 DIAGNOSIS — K602 Anal fissure, unspecified: Secondary | ICD-10-CM | POA: Diagnosis not present

## 2018-05-19 DIAGNOSIS — K6289 Other specified diseases of anus and rectum: Secondary | ICD-10-CM | POA: Diagnosis not present

## 2018-05-19 DIAGNOSIS — K644 Residual hemorrhoidal skin tags: Secondary | ICD-10-CM | POA: Diagnosis not present

## 2018-05-19 DIAGNOSIS — F329 Major depressive disorder, single episode, unspecified: Secondary | ICD-10-CM | POA: Diagnosis not present

## 2018-05-19 DIAGNOSIS — H6983 Other specified disorders of Eustachian tube, bilateral: Secondary | ICD-10-CM | POA: Diagnosis not present

## 2018-05-19 DIAGNOSIS — Z79899 Other long term (current) drug therapy: Secondary | ICD-10-CM | POA: Diagnosis not present

## 2018-05-19 DIAGNOSIS — G4700 Insomnia, unspecified: Secondary | ICD-10-CM | POA: Diagnosis not present

## 2018-05-19 DIAGNOSIS — Z9889 Other specified postprocedural states: Secondary | ICD-10-CM | POA: Diagnosis not present

## 2018-05-19 DIAGNOSIS — G8929 Other chronic pain: Secondary | ICD-10-CM | POA: Diagnosis not present

## 2018-05-22 DIAGNOSIS — M20011 Mallet finger of right finger(s): Secondary | ICD-10-CM | POA: Diagnosis not present

## 2018-05-22 DIAGNOSIS — S83241A Other tear of medial meniscus, current injury, right knee, initial encounter: Secondary | ICD-10-CM | POA: Diagnosis not present

## 2018-05-22 DIAGNOSIS — M1712 Unilateral primary osteoarthritis, left knee: Secondary | ICD-10-CM | POA: Diagnosis not present

## 2018-05-24 DIAGNOSIS — M9903 Segmental and somatic dysfunction of lumbar region: Secondary | ICD-10-CM | POA: Diagnosis not present

## 2018-05-24 DIAGNOSIS — M2141 Flat foot [pes planus] (acquired), right foot: Secondary | ICD-10-CM | POA: Diagnosis not present

## 2018-05-24 DIAGNOSIS — M5116 Intervertebral disc disorders with radiculopathy, lumbar region: Secondary | ICD-10-CM | POA: Diagnosis not present

## 2018-05-24 DIAGNOSIS — M2142 Flat foot [pes planus] (acquired), left foot: Secondary | ICD-10-CM | POA: Diagnosis not present

## 2018-05-26 DIAGNOSIS — M5116 Intervertebral disc disorders with radiculopathy, lumbar region: Secondary | ICD-10-CM | POA: Diagnosis not present

## 2018-05-26 DIAGNOSIS — M2141 Flat foot [pes planus] (acquired), right foot: Secondary | ICD-10-CM | POA: Diagnosis not present

## 2018-05-26 DIAGNOSIS — M2142 Flat foot [pes planus] (acquired), left foot: Secondary | ICD-10-CM | POA: Diagnosis not present

## 2018-05-26 DIAGNOSIS — M9903 Segmental and somatic dysfunction of lumbar region: Secondary | ICD-10-CM | POA: Diagnosis not present

## 2018-05-26 DIAGNOSIS — G479 Sleep disorder, unspecified: Secondary | ICD-10-CM | POA: Diagnosis not present

## 2018-05-30 DIAGNOSIS — M5116 Intervertebral disc disorders with radiculopathy, lumbar region: Secondary | ICD-10-CM | POA: Diagnosis not present

## 2018-05-30 DIAGNOSIS — M2141 Flat foot [pes planus] (acquired), right foot: Secondary | ICD-10-CM | POA: Diagnosis not present

## 2018-05-30 DIAGNOSIS — M9903 Segmental and somatic dysfunction of lumbar region: Secondary | ICD-10-CM | POA: Diagnosis not present

## 2018-05-30 DIAGNOSIS — M2142 Flat foot [pes planus] (acquired), left foot: Secondary | ICD-10-CM | POA: Diagnosis not present

## 2018-06-02 DIAGNOSIS — M2141 Flat foot [pes planus] (acquired), right foot: Secondary | ICD-10-CM | POA: Diagnosis not present

## 2018-06-02 DIAGNOSIS — M20011 Mallet finger of right finger(s): Secondary | ICD-10-CM | POA: Diagnosis not present

## 2018-06-02 DIAGNOSIS — M9903 Segmental and somatic dysfunction of lumbar region: Secondary | ICD-10-CM | POA: Diagnosis not present

## 2018-06-02 DIAGNOSIS — M5116 Intervertebral disc disorders with radiculopathy, lumbar region: Secondary | ICD-10-CM | POA: Diagnosis not present

## 2018-06-02 DIAGNOSIS — M2142 Flat foot [pes planus] (acquired), left foot: Secondary | ICD-10-CM | POA: Diagnosis not present

## 2018-06-06 DIAGNOSIS — M5116 Intervertebral disc disorders with radiculopathy, lumbar region: Secondary | ICD-10-CM | POA: Diagnosis not present

## 2018-06-06 DIAGNOSIS — M2141 Flat foot [pes planus] (acquired), right foot: Secondary | ICD-10-CM | POA: Diagnosis not present

## 2018-06-06 DIAGNOSIS — M9903 Segmental and somatic dysfunction of lumbar region: Secondary | ICD-10-CM | POA: Diagnosis not present

## 2018-06-06 DIAGNOSIS — M2142 Flat foot [pes planus] (acquired), left foot: Secondary | ICD-10-CM | POA: Diagnosis not present

## 2018-06-08 DIAGNOSIS — M5116 Intervertebral disc disorders with radiculopathy, lumbar region: Secondary | ICD-10-CM | POA: Diagnosis not present

## 2018-06-08 DIAGNOSIS — M2141 Flat foot [pes planus] (acquired), right foot: Secondary | ICD-10-CM | POA: Diagnosis not present

## 2018-06-08 DIAGNOSIS — M9903 Segmental and somatic dysfunction of lumbar region: Secondary | ICD-10-CM | POA: Diagnosis not present

## 2018-06-08 DIAGNOSIS — M2142 Flat foot [pes planus] (acquired), left foot: Secondary | ICD-10-CM | POA: Diagnosis not present

## 2018-06-13 DIAGNOSIS — M9903 Segmental and somatic dysfunction of lumbar region: Secondary | ICD-10-CM | POA: Diagnosis not present

## 2018-06-13 DIAGNOSIS — M2142 Flat foot [pes planus] (acquired), left foot: Secondary | ICD-10-CM | POA: Diagnosis not present

## 2018-06-13 DIAGNOSIS — M2141 Flat foot [pes planus] (acquired), right foot: Secondary | ICD-10-CM | POA: Diagnosis not present

## 2018-06-13 DIAGNOSIS — M5116 Intervertebral disc disorders with radiculopathy, lumbar region: Secondary | ICD-10-CM | POA: Diagnosis not present

## 2018-06-15 DIAGNOSIS — M20011 Mallet finger of right finger(s): Secondary | ICD-10-CM | POA: Diagnosis not present

## 2018-06-15 DIAGNOSIS — M2141 Flat foot [pes planus] (acquired), right foot: Secondary | ICD-10-CM | POA: Diagnosis not present

## 2018-06-15 DIAGNOSIS — M5116 Intervertebral disc disorders with radiculopathy, lumbar region: Secondary | ICD-10-CM | POA: Diagnosis not present

## 2018-06-15 DIAGNOSIS — M9903 Segmental and somatic dysfunction of lumbar region: Secondary | ICD-10-CM | POA: Diagnosis not present

## 2018-06-15 DIAGNOSIS — M2142 Flat foot [pes planus] (acquired), left foot: Secondary | ICD-10-CM | POA: Diagnosis not present

## 2018-06-16 DIAGNOSIS — M1712 Unilateral primary osteoarthritis, left knee: Secondary | ICD-10-CM | POA: Diagnosis not present

## 2018-06-27 DIAGNOSIS — M5116 Intervertebral disc disorders with radiculopathy, lumbar region: Secondary | ICD-10-CM | POA: Diagnosis not present

## 2018-06-27 DIAGNOSIS — M2141 Flat foot [pes planus] (acquired), right foot: Secondary | ICD-10-CM | POA: Diagnosis not present

## 2018-06-27 DIAGNOSIS — M9903 Segmental and somatic dysfunction of lumbar region: Secondary | ICD-10-CM | POA: Diagnosis not present

## 2018-06-27 DIAGNOSIS — M2142 Flat foot [pes planus] (acquired), left foot: Secondary | ICD-10-CM | POA: Diagnosis not present

## 2018-06-28 DIAGNOSIS — G4700 Insomnia, unspecified: Secondary | ICD-10-CM | POA: Diagnosis not present

## 2018-06-28 DIAGNOSIS — R0683 Snoring: Secondary | ICD-10-CM | POA: Diagnosis not present

## 2018-06-29 DIAGNOSIS — G4710 Hypersomnia, unspecified: Secondary | ICD-10-CM | POA: Diagnosis not present

## 2018-06-30 DIAGNOSIS — M5116 Intervertebral disc disorders with radiculopathy, lumbar region: Secondary | ICD-10-CM | POA: Diagnosis not present

## 2018-06-30 DIAGNOSIS — M2142 Flat foot [pes planus] (acquired), left foot: Secondary | ICD-10-CM | POA: Diagnosis not present

## 2018-06-30 DIAGNOSIS — M9903 Segmental and somatic dysfunction of lumbar region: Secondary | ICD-10-CM | POA: Diagnosis not present

## 2018-06-30 DIAGNOSIS — M2141 Flat foot [pes planus] (acquired), right foot: Secondary | ICD-10-CM | POA: Diagnosis not present

## 2018-07-06 DIAGNOSIS — M5116 Intervertebral disc disorders with radiculopathy, lumbar region: Secondary | ICD-10-CM | POA: Diagnosis not present

## 2018-07-06 DIAGNOSIS — M2142 Flat foot [pes planus] (acquired), left foot: Secondary | ICD-10-CM | POA: Diagnosis not present

## 2018-07-06 DIAGNOSIS — M2141 Flat foot [pes planus] (acquired), right foot: Secondary | ICD-10-CM | POA: Diagnosis not present

## 2018-07-06 DIAGNOSIS — M9903 Segmental and somatic dysfunction of lumbar region: Secondary | ICD-10-CM | POA: Diagnosis not present

## 2018-07-13 DIAGNOSIS — M5116 Intervertebral disc disorders with radiculopathy, lumbar region: Secondary | ICD-10-CM | POA: Diagnosis not present

## 2018-07-13 DIAGNOSIS — M9903 Segmental and somatic dysfunction of lumbar region: Secondary | ICD-10-CM | POA: Diagnosis not present

## 2018-07-13 DIAGNOSIS — M2142 Flat foot [pes planus] (acquired), left foot: Secondary | ICD-10-CM | POA: Diagnosis not present

## 2018-07-13 DIAGNOSIS — M2141 Flat foot [pes planus] (acquired), right foot: Secondary | ICD-10-CM | POA: Diagnosis not present

## 2018-07-18 DIAGNOSIS — M2142 Flat foot [pes planus] (acquired), left foot: Secondary | ICD-10-CM | POA: Diagnosis not present

## 2018-07-18 DIAGNOSIS — M9903 Segmental and somatic dysfunction of lumbar region: Secondary | ICD-10-CM | POA: Diagnosis not present

## 2018-07-18 DIAGNOSIS — M2141 Flat foot [pes planus] (acquired), right foot: Secondary | ICD-10-CM | POA: Diagnosis not present

## 2018-07-18 DIAGNOSIS — M5116 Intervertebral disc disorders with radiculopathy, lumbar region: Secondary | ICD-10-CM | POA: Diagnosis not present

## 2018-08-11 DIAGNOSIS — M2141 Flat foot [pes planus] (acquired), right foot: Secondary | ICD-10-CM | POA: Diagnosis not present

## 2018-08-11 DIAGNOSIS — M2142 Flat foot [pes planus] (acquired), left foot: Secondary | ICD-10-CM | POA: Diagnosis not present

## 2018-08-11 DIAGNOSIS — M9903 Segmental and somatic dysfunction of lumbar region: Secondary | ICD-10-CM | POA: Diagnosis not present

## 2018-08-11 DIAGNOSIS — M5116 Intervertebral disc disorders with radiculopathy, lumbar region: Secondary | ICD-10-CM | POA: Diagnosis not present

## 2018-09-15 DIAGNOSIS — M25561 Pain in right knee: Secondary | ICD-10-CM | POA: Diagnosis not present

## 2018-09-15 DIAGNOSIS — M1712 Unilateral primary osteoarthritis, left knee: Secondary | ICD-10-CM | POA: Diagnosis not present

## 2018-09-15 DIAGNOSIS — M25562 Pain in left knee: Secondary | ICD-10-CM | POA: Diagnosis not present

## 2018-12-15 DIAGNOSIS — M170 Bilateral primary osteoarthritis of knee: Secondary | ICD-10-CM | POA: Diagnosis not present

## 2019-03-16 DIAGNOSIS — M170 Bilateral primary osteoarthritis of knee: Secondary | ICD-10-CM | POA: Diagnosis not present

## 2019-06-08 DIAGNOSIS — Z01419 Encounter for gynecological examination (general) (routine) without abnormal findings: Secondary | ICD-10-CM | POA: Diagnosis not present

## 2019-06-08 DIAGNOSIS — Z1231 Encounter for screening mammogram for malignant neoplasm of breast: Secondary | ICD-10-CM | POA: Diagnosis not present

## 2019-06-08 DIAGNOSIS — Z124 Encounter for screening for malignant neoplasm of cervix: Secondary | ICD-10-CM | POA: Diagnosis not present

## 2019-06-08 DIAGNOSIS — Z6827 Body mass index (BMI) 27.0-27.9, adult: Secondary | ICD-10-CM | POA: Diagnosis not present

## 2019-06-15 DIAGNOSIS — M170 Bilateral primary osteoarthritis of knee: Secondary | ICD-10-CM | POA: Diagnosis not present

## 2019-07-08 ENCOUNTER — Inpatient Hospital Stay (HOSPITAL_COMMUNITY)
Admission: EM | Admit: 2019-07-08 | Discharge: 2019-07-10 | DRG: 482 | Disposition: A | Discharge status: Home / Self Care | Payer: BC Managed Care – PPO | Attending: Internal Medicine | Admitting: Internal Medicine

## 2019-07-08 ENCOUNTER — Encounter (HOSPITAL_COMMUNITY): Payer: Self-pay | Admitting: *Deleted

## 2019-07-08 ENCOUNTER — Inpatient Hospital Stay (HOSPITAL_COMMUNITY): Payer: BC Managed Care – PPO

## 2019-07-08 ENCOUNTER — Other Ambulatory Visit: Payer: Self-pay

## 2019-07-08 ENCOUNTER — Emergency Department (HOSPITAL_COMMUNITY): Payer: BC Managed Care – PPO

## 2019-07-08 DIAGNOSIS — Z20828 Contact with and (suspected) exposure to other viral communicable diseases: Secondary | ICD-10-CM | POA: Diagnosis not present

## 2019-07-08 DIAGNOSIS — S72011A Unspecified intracapsular fracture of right femur, initial encounter for closed fracture: Secondary | ICD-10-CM | POA: Diagnosis not present

## 2019-07-08 DIAGNOSIS — Z9889 Other specified postprocedural states: Secondary | ICD-10-CM

## 2019-07-08 DIAGNOSIS — M25461 Effusion, right knee: Secondary | ICD-10-CM | POA: Diagnosis not present

## 2019-07-08 DIAGNOSIS — W1830XA Fall on same level, unspecified, initial encounter: Secondary | ICD-10-CM | POA: Diagnosis not present

## 2019-07-08 DIAGNOSIS — Z79899 Other long term (current) drug therapy: Secondary | ICD-10-CM | POA: Diagnosis not present

## 2019-07-08 DIAGNOSIS — M25551 Pain in right hip: Secondary | ICD-10-CM | POA: Diagnosis not present

## 2019-07-08 DIAGNOSIS — K219 Gastro-esophageal reflux disease without esophagitis: Secondary | ICD-10-CM | POA: Diagnosis not present

## 2019-07-08 DIAGNOSIS — Z87891 Personal history of nicotine dependence: Secondary | ICD-10-CM

## 2019-07-08 DIAGNOSIS — S72091A Other fracture of head and neck of right femur, initial encounter for closed fracture: Secondary | ICD-10-CM | POA: Diagnosis not present

## 2019-07-08 DIAGNOSIS — S72001A Fracture of unspecified part of neck of right femur, initial encounter for closed fracture: Secondary | ICD-10-CM | POA: Diagnosis not present

## 2019-07-08 DIAGNOSIS — S72091D Other fracture of head and neck of right femur, subsequent encounter for closed fracture with routine healing: Secondary | ICD-10-CM | POA: Diagnosis not present

## 2019-07-08 DIAGNOSIS — Z03818 Encounter for observation for suspected exposure to other biological agents ruled out: Secondary | ICD-10-CM | POA: Diagnosis not present

## 2019-07-08 DIAGNOSIS — F3281 Premenstrual dysphoric disorder: Secondary | ICD-10-CM | POA: Diagnosis present

## 2019-07-08 DIAGNOSIS — J302 Other seasonal allergic rhinitis: Secondary | ICD-10-CM | POA: Diagnosis not present

## 2019-07-08 DIAGNOSIS — S299XXA Unspecified injury of thorax, initial encounter: Secondary | ICD-10-CM | POA: Diagnosis not present

## 2019-07-08 DIAGNOSIS — Y93K1 Activity, walking an animal: Secondary | ICD-10-CM | POA: Diagnosis not present

## 2019-07-08 DIAGNOSIS — M810 Age-related osteoporosis without current pathological fracture: Secondary | ICD-10-CM | POA: Diagnosis not present

## 2019-07-08 DIAGNOSIS — Z01818 Encounter for other preprocedural examination: Secondary | ICD-10-CM | POA: Diagnosis not present

## 2019-07-08 DIAGNOSIS — Z8781 Personal history of (healed) traumatic fracture: Secondary | ICD-10-CM

## 2019-07-08 DIAGNOSIS — E785 Hyperlipidemia, unspecified: Secondary | ICD-10-CM | POA: Diagnosis not present

## 2019-07-08 DIAGNOSIS — F329 Major depressive disorder, single episode, unspecified: Secondary | ICD-10-CM | POA: Diagnosis not present

## 2019-07-08 DIAGNOSIS — Z419 Encounter for procedure for purposes other than remedying health state, unspecified: Secondary | ICD-10-CM

## 2019-07-08 DIAGNOSIS — W19XXXA Unspecified fall, initial encounter: Secondary | ICD-10-CM

## 2019-07-08 HISTORY — DX: Nausea with vomiting, unspecified: R11.2

## 2019-07-08 HISTORY — DX: Other specified postprocedural states: Z98.890

## 2019-07-08 LAB — BASIC METABOLIC PANEL
Anion gap: 11 (ref 5–15)
BUN: 29 mg/dL — ABNORMAL HIGH (ref 8–23)
CO2: 24 mmol/L (ref 22–32)
Calcium: 9.2 mg/dL (ref 8.9–10.3)
Chloride: 103 mmol/L (ref 98–111)
Creatinine, Ser: 1 mg/dL (ref 0.44–1.00)
GFR calc Af Amer: >60 mL/min (ref >60)
GFR calc non Af Amer: >60 mL/min (ref >60)
Glucose, Bld: 119 mg/dL — ABNORMAL HIGH (ref 70–99)
Potassium: 3.7 mmol/L (ref 3.5–5.1)
Sodium: 138 mmol/L (ref 135–145)

## 2019-07-08 LAB — CBC WITH DIFFERENTIAL/PLATELET
Abs Immature Granulocytes: 0.05 10*3/uL (ref 0.00–0.07)
Basophils Absolute: 0.1 10*3/uL (ref 0.0–0.1)
Basophils Relative: 1 %
Eosinophils Absolute: 0.1 10*3/uL (ref 0.0–0.5)
Eosinophils Relative: 1 %
HCT: 35.2 % — ABNORMAL LOW (ref 36.0–46.0)
Hemoglobin: 10.9 g/dL — ABNORMAL LOW (ref 12.0–15.0)
Immature Granulocytes: 1 %
Lymphocytes Relative: 13 %
Lymphs Abs: 1.3 10*3/uL (ref 0.7–4.0)
MCH: 26.5 pg (ref 26.0–34.0)
MCHC: 31 g/dL (ref 30.0–36.0)
MCV: 85.6 fL (ref 80.0–100.0)
Monocytes Absolute: 0.6 10*3/uL (ref 0.1–1.0)
Monocytes Relative: 6 %
Neutro Abs: 7.9 10*3/uL — ABNORMAL HIGH (ref 1.7–7.7)
Neutrophils Relative %: 78 %
Platelets: 322 10*3/uL (ref 150–400)
RBC: 4.11 MIL/uL (ref 3.87–5.11)
RDW: 17.2 % — ABNORMAL HIGH (ref 11.5–15.5)
WBC: 10 10*3/uL (ref 4.0–10.5)
nRBC: 0 % (ref 0.0–0.2)

## 2019-07-08 LAB — PROTIME-INR
INR: 0.9 (ref 0.8–1.2)
Prothrombin Time: 12.5 seconds (ref 11.4–15.2)

## 2019-07-08 MED ORDER — HYDROCODONE-ACETAMINOPHEN 5-325 MG PO TABS
1.0000 | ORAL_TABLET | Freq: Four times a day (QID) | ORAL | Status: DC | PRN
  Administered 2019-07-08 – 2019-07-09 (×2): 2 via ORAL
  Filled 2019-07-08 (×2): qty 2

## 2019-07-08 MED ORDER — TRAZODONE HCL 50 MG PO TABS
50.0000 mg | ORAL_TABLET | Freq: Every evening | ORAL | Status: DC | PRN
  Administered 2019-07-08: 50 mg via ORAL
  Filled 2019-07-08: qty 1

## 2019-07-08 MED ORDER — MORPHINE SULFATE (PF) 2 MG/ML IV SOLN: 0.5000 mg | INTRAVENOUS | Status: DC | PRN

## 2019-07-08 MED ORDER — MAGNESIUM OXIDE 400 (241.3 MG) MG PO TABS
200.0000 mg | ORAL_TABLET | Freq: Every day | ORAL | Status: DC
  Administered 2019-07-08 – 2019-07-10 (×2): 200 mg via ORAL
  Filled 2019-07-08 (×2): qty 1

## 2019-07-08 MED ORDER — SERTRALINE HCL 25 MG PO TABS
25.0000 mg | ORAL_TABLET | Freq: Every day | ORAL | Status: DC
  Administered 2019-07-10: 25 mg via ORAL
  Filled 2019-07-08: qty 1

## 2019-07-08 MED ORDER — FENTANYL CITRATE (PF) 100 MCG/2ML IJ SOLN
50.0000 ug | Freq: Once | INTRAMUSCULAR | Status: AC
  Administered 2019-07-08: 50 ug via INTRAVENOUS
  Filled 2019-07-08: qty 2

## 2019-07-08 MED ORDER — MAGNESIUM 30 MG PO TABS: 30.0000 mg | ORAL_TABLET | Freq: Every day | ORAL | Status: DC

## 2019-07-08 MED ORDER — FENTANYL CITRATE (PF) 100 MCG/2ML IJ SOLN
50.0000 ug | INTRAMUSCULAR | Status: AC | PRN
  Administered 2019-07-08 (×2): 50 ug via INTRAVENOUS
  Filled 2019-07-08: qty 2

## 2019-07-08 MED ORDER — ALBUTEROL SULFATE HFA 108 (90 BASE) MCG/ACT IN AERS
2.0000 | INHALATION_SPRAY | Freq: Four times a day (QID) | RESPIRATORY_TRACT | Status: DC | PRN
  Filled 2019-07-08: qty 6.7

## 2019-07-08 MED ORDER — FENTANYL CITRATE (PF) 100 MCG/2ML IJ SOLN
25.0000 ug | INTRAMUSCULAR | Status: DC | PRN
  Administered 2019-07-08 – 2019-07-09 (×4): 50 ug via INTRAVENOUS
  Filled 2019-07-08 (×4): qty 2

## 2019-07-08 NOTE — ED Notes (Signed)
ED TO INPATIENT HANDOFF REPORT  ED Nurse Name and Phone #: Judye Bos Name/Age/Gender Erin Arnold 63 y.o. female Room/Bed: WA06/WA06  Code Status   Code Status: Full Code  Home/SNF/Other Home Patient oriented to: self, place, time and situation Is this baseline? Yes   Triage Complete: Triage complete  Chief Complaint Fracture of femoral neck, right, closed ([place]) [S72.001A]  Triage Note Pt presents from home after she had a fall.  Pt was distracted and fell onto her right side.  Pt believes that she has a hip fracture. Pt a/o x 4.     Allergies No Known Allergies  Level of Care/Admitting Diagnosis ED Disposition    ED Disposition Condition Comment   Admit  Hospital Area: [place] [[id number]]  Level of Care: Med-Surg [16]  Covid Evaluation: Asymptomatic Screening Protocol (No Symptoms)  Diagnosis: Fracture of femoral neck, right, closed [facility]) [[id number]]  Admitting Physician: Doreatha Massed  Attending Physician: Etta Quill [phone]  Estimated length of stay: past midnight tomorrow  Certification:: I certify this patient will need inpatient services for at least 2 midnights       B Medical/Surgery History Past Medical History:  Diagnosis Date  . Carpal tunnel syndrome of right wrist 05/2013  . Constipation    occasional  . GERD (gastroesophageal reflux disease)    OTC as needed  . Headache(784.0)    sinus  . History of colitis   . History of palpitations    under control with Metoprolol  . PMDD (premenstrual dysphoric disorder)    takes HCTZ to control sx.  . Seasonal allergies    Past Surgical History:  Procedure Laterality Date  . CARPAL TUNNEL RELEASE Right 06/14/2013   Procedure: CARPAL TUNNEL RELEASE; RIGHT ;  Surgeon: Ninetta Lights, MD;  Location: [place];  Service: Orthopedics;  Laterality: Right;  . ELBOW ARTHROSCOPY Left 02/08/2013   Procedure: LEFT ELBOW ARTHROSCOPY WITH DEBRIDEMENT LIMITED/  TENNIS ELBOW  REPAIR, PARTIAL SYNOVECTOMY AND CHONDROPLASTY;  Surgeon: Ninetta Lights, MD;  Location: [place];  Service: Orthopedics;  Laterality: Left;  . KNEE ARTHROSCOPY W/ ACL RECONSTRUCTION Left 02/18/2011   x 2  . WRIST SURGERY Left 05/26/2012   radial nerve decompression     A IV Location/Drains/Wounds Patient Lines/Drains/Airways Status   Active Line/Drains/Airways    Name:   Placement date:   Placement time:   Site:   Days:   Peripheral IV 07/08/19 Right Antecubital   07/08/19    1858    Antecubital   less than 1   External Urinary Catheter   07/08/19    2122    --   less than 1   Incision 02/08/13 Arm Left   02/08/13    0912     2341   Incision 06/14/13 Hand Right   06/14/13    0808     2215          Intake/Output Last 24 hours No intake or output data in the 24 hours ending 07/08/19 2235  Labs/Imaging Results for orders placed or performed during the hospital encounter of 07/08/19 (from the past 48 hour(s))  Basic metabolic panel     Status: Abnormal   Collection Time: 07/08/19  7:09 PM  Result Value Ref Range   Sodium 138 135 - 145 mmol/L   Potassium 3.7 3.5 - 5.1 mmol/L   Chloride 103 98 - 111 mmol/L   CO2 24 22 - 32 mmol/L  Glucose, Bld 119 (H) 70 - 99 mg/dL   BUN 29 (H) 8 - 23 mg/dL   Creatinine, Ser 1.611.00 0.44 - 1.00 mg/dL   Calcium 9.2 8.9 - 09.610.3 mg/dL   GFR calc non Af Amer >60 >60 mL/min   GFR calc Af Amer >60 >60 mL/min   Anion gap 11 5 - 15    Comment: Performed at [facility], [address] [place], [place] [address]  CBC WITH DIFFERENTIAL     Status: Abnormal   Collection Time: 07/08/19  7:09 PM  Result Value Ref Range   WBC 10.0 4.0 - 10.5 K/uL   RBC 4.11 3.87 - 5.11 MIL/uL   Hemoglobin 10.9 (L) 12.0 - 15.0 g/dL   HCT 98.135.2 (L) 19.136.0 - 47.846.0 %   MCV 85.6 80.0 - 100.0 fL   MCH 26.5 26.0 - 34.0 pg   MCHC 31.0 30.0 - 36.0 g/dL   RDW 29.517.2 (H) 62.111.5 - 30.815.5 %   Platelets 322 150 - 400 K/uL   nRBC 0.0 0.0 - 0.2 %    Neutrophils Relative % 78 %   Neutro Abs 7.9 (H) 1.7 - 7.7 K/uL   Lymphocytes Relative 13 %   Lymphs Abs 1.3 0.7 - 4.0 K/uL   Monocytes Relative 6 %   Monocytes Absolute 0.6 0.1 - 1.0 K/uL   Eosinophils Relative 1 %   Eosinophils Absolute 0.1 0.0 - 0.5 K/uL   Basophils Relative 1 %   Basophils Absolute 0.1 0.0 - 0.1 K/uL   Immature Granulocytes 1 %   Abs Immature Granulocytes 0.05 0.00 - 0.07 K/uL    Comment: Performed at [facility], [address] [place], [place] [address]  Protime-INR     Status: None   Collection Time: 07/08/19  7:09 PM  Result Value Ref Range   Prothrombin Time 12.5 11.4 - 15.2 seconds   INR 0.9 0.8 - 1.2    Comment: (NOTE) INR goal varies based on device and disease states. Performed at [facility], [address] [place], [place] [address]    DG Chest 1 View  Result Date: 07/08/2019 CLINICAL DATA:  Pt presents from home after she had a fall. Pt was distracted and fell onto her right side. Pt believes that she has a hip fracture. Pt a/o x 4. Pre op for right hip fracture. No chest complaints. EXAM: CHEST  1 VIEW COMPARISON:  None. FINDINGS: The heart size and mediastinal contours are within normal limits. The lungs are clear. No pneumothorax or large pleural effusion. The visualized skeletal structures are unremarkable. IMPRESSION: No active cardiopulmonary disease. Electronically Signed   By: Emmaline KluverNancy  Ballantyne M.D.   On: 07/08/2019 19:24   DG Knee Right Port  Result Date: 07/08/2019 CLINICAL DATA:  Initial preoperative evaluation, known hip fracture. EXAM: PORTABLE RIGHT KNEE - 1-2 VIEW COMPARISON:  Prior radiograph of the right hip performed earlier the same day. FINDINGS: No acute fracture or dislocation. Small joint effusion noted. Mild-to-moderate tricompartmental degenerative osteoarthrosis. Osseous mineralization normal. No soft tissue abnormality. IMPRESSION: 1. No acute fracture or dislocation. 2. Small  joint effusion. 3. Mild-to-moderate tricompartmental degenerative osteoarthrosis. Electronically Signed   By: Rise MuBenjamin  McClintock M.D.   On: 07/08/2019 22:14   DG Hip Unilat  With Pelvis 2-3 Views Right  Result Date: 07/08/2019 CLINICAL DATA:  Initial evaluation for acute trauma, fall. Right hip pain. EXAM: DG HIP (WITH OR WITHOUT PELVIS) 2-3V RIGHT COMPARISON:  None. FINDINGS: Subtle cortical disruption  with linear lucency seen extending through the medial aspect of the right femoral neck, consistent with acute nondisplaced right femoral neck fracture. Femoral head remains normally position within the acetabulum. Bony pelvis intact. Limited views of the left hip unremarkable. Degenerative spondylosis noted within the lower lumbar spine. No acute soft tissue abnormality. IMPRESSION: Acute nondisplaced fracture involving the right femoral neck. Electronically Signed   By: Rise Mu M.D.   On: 07/08/2019 19:26    Pending Labs Unresulted Labs (From admission, onward)    Start     Ordered   07/09/19 0500  CBC  Tomorrow morning,   R     07/08/19 2111   07/09/19 0500  VITAMIN D 25 Hydroxy (Vit-D Deficiency, Fractures)  Tomorrow morning,   R     07/08/19 2111   07/09/19 0500  INR Routine  Tomorrow morning,   R     07/08/19 2111   07/09/19 0500  Comprehensive metabolic panel  Tomorrow morning,   R     07/08/19 2112   07/08/19 2049  HIV Antibody (routine testing w rflx)  (HIV Antibody (Routine testing w reflex) panel)  Once,   STAT     07/08/19 2052   07/08/19 1910  SARS CORONAVIRUS 2 (TAT 6-24 HRS) Nasopharyngeal Nasopharyngeal Swab  (Tier 3 (TAT 6-24 hrs))  Once,   STAT    Question Answer Comment  Is this test for diagnosis or screening Screening   Symptomatic for COVID-19 as defined by CDC No   Hospitalized for COVID-19 No   Admitted to ICU for COVID-19 No   Previously tested for COVID-19 No   Resident in a congregate (group) care setting No   Employed in healthcare setting No    Pregnant No      07/08/19 1909   07/08/19 1909  Type and screen [facility]  ONCE - STAT,   STAT    Comments: [facility]    07/08/19 1909          Vitals/Pain Today's Vitals   07/08/19 2012 07/08/19 2100 07/08/19 2129 07/08/19 2146  BP:  117/75    Pulse: (!) 112 94    Resp: 20 (!) 25    Temp:      TempSrc:      SpO2: 100% 100%    Weight:      Height:      PainSc:   10-Worst pain ever 5     Isolation Precautions No active isolations  Medications Medications  albuterol (VENTOLIN HFA) 108 (90 Base) MCG/ACT inhaler 2 puff (0 puffs Inhalation Hold 07/08/19 2134)  sertraline (ZOLOFT) tablet 25 mg (0 mg Oral Hold 07/08/19 2135)  traZODone (DESYREL) tablet 50 mg (has no administration in time range)  magnesium oxide (MAG-OX) tablet 200 mg (has no administration in time range)  HYDROcodone-acetaminophen (NORCO/VICODIN) 5-325 MG per tablet 1-2 tablet (has no administration in time range)  fentaNYL (SUBLIMAZE) injection 25-50 mcg (has no administration in time range)  fentaNYL (SUBLIMAZE) injection 50 mcg (50 mcg Intravenous Given 07/08/19 1905)  fentaNYL (SUBLIMAZE) injection 50 mcg (50 mcg Intravenous Given 07/08/19 2137)    Mobility non-ambulatory Low fall risk   Focused Assessments NA   R Recommendations: See Admitting Provider Note  Report given to:   Additional Notes: NA

## 2019-07-08 NOTE — ED Notes (Signed)
Pt transporting to floor now

## 2019-07-08 NOTE — Progress Notes (Signed)
Consult received for right femoral neck fracture with minimal displacement in 63 yo with few medical problems.   May be a candidate for cannulated screw fixation tomorrow but if found to be displaced then would be better suited to total joint replacement as opposed to a hemiarthroplasty.  Will contact Dr. Mardelle Matte for his direction. Will remain NPO post MN.  [place], MD [facility], [facility] [phone]

## 2019-07-08 NOTE — ED Provider Notes (Signed)
[facility]-EMERGENCY DEPT Provider Note   CSN: [id number] Arrival date & time: 07/08/19  1835     History Chief Complaint  Patient presents with  . Fall  . Hip Pain    Right    [facility] is a 63 y.o. female.  HPI    63 year old female comes with a chief complaint of fall.  Patient was walking her dog and had a mechanical fall. She is complaining of pain to the right hip.  She got up and walked to her home but as time went on her pain was progressing and she felt like her extremity was shortened so she came to the ER.  Patient denies any head trauma, headache, neck pain, numbness, tingling, chest pain, shortness of breath, abdominal pain, back pain.  She is not on any blood thinners.  Accident occurred around 4:00.  Past Medical History:  Diagnosis Date  . Carpal tunnel syndrome of right wrist 05/2013  . Constipation    occasional  . GERD (gastroesophageal reflux disease)    OTC as needed  . Headache(784.0)    sinus  . History of colitis   . History of palpitations    under control with Metoprolol  . PMDD (premenstrual dysphoric disorder)    takes HCTZ to control sx.  . Seasonal allergies     Patient Active Problem List   Diagnosis Date Noted  . Fracture of femoral neck, right, closed (HCC) 07/08/2019  . Carpal tunnel syndrome of right wrist 06/14/2013  . Dyslipidemia 11/10/2012  . Palpitations 11/05/2011    Past Surgical History:  Procedure Laterality Date  . CARPAL TUNNEL RELEASE Right 06/14/2013   Procedure: CARPAL TUNNEL RELEASE; RIGHT ;  Surgeon: Loreta Aveaniel F Murphy, MD;  Location: [facility];  Service: Orthopedics;  Laterality: Right;  . ELBOW ARTHROSCOPY Left 02/08/2013   Procedure: LEFT ELBOW ARTHROSCOPY WITH DEBRIDEMENT LIMITED/ TENNIS ELBOW  REPAIR, PARTIAL SYNOVECTOMY AND CHONDROPLASTY;  Surgeon: Loreta Aveaniel F Murphy, MD;  Location: [facility];  Service: Orthopedics;  Laterality: Left;  . KNEE ARTHROSCOPY  W/ ACL RECONSTRUCTION Left 02/18/2011   x 2  . WRIST SURGERY Left 05/26/2012   radial nerve decompression     OB History   No obstetric history on file.     No family history on file.  Social History   Tobacco Use  . Smoking status: Former Smoker    Quit date: 07/26/1976    Years since quitting: 42.9  . Smokeless tobacco: Never Used  Substance Use Topics  . Alcohol use: Yes    Comment: occasional beer  . Drug use: No    Home Medications Prior to Admission medications   Medication Sig Start Date End Date Taking? Authorizing Provider  albuterol (PROVENTIL HFA;VENTOLIN HFA) 108 (90 Base) MCG/ACT inhaler Inhale 2 puffs into the lungs every 6 (six) hours as needed for wheezing or shortness of breath. 04/28/18  Yes Nahser, Deloris PingPhilip J, MD  Ascorbic Acid (VITAMIN C PO) Take 1 tablet by mouth daily.    Yes [provider]  CA-MG-VIT D-L METHYLFOL-B6-B12 PO Take 1 tablet by mouth daily.    Yes [provider]  Coenzyme Q10 10 MG capsule Take 1 capsule by mouth daily.   Yes [provider]  lidocaine (XYLOCAINE) 5 % ointment Apply 1 application topically 4 (four) times daily as needed for pain. 02/08/19  Yes [provider]  LYSINE PO Take 1 mg by mouth daily.    Yes [provider]  magnesium 30 MG tablet Take 30 mg by mouth daily.    Yes [provider]  Menaquinone-7 (VITAMIN K2) 100 MCG CAPS Take 100 mcg by mouth daily.   Yes [provider]  Misc Natural Products (OSTEO BI-FLEX ADV JOINT SHIELD PO) Take 1 tablet by mouth daily.    Yes [provider]  Multiple Vitamins-Minerals (ZINC PO) Take 1 tablet by mouth daily.    Yes [provider]  Probiotic Product (PROBIOTIC FORMULA PO) Take 1 tablet by mouth daily.    Yes [provider]  sertraline (ZOLOFT) 50 MG tablet Take 25 mg by mouth daily.    Yes [provider]  traZODone (DESYREL) 50 MG tablet Take 50 mg by mouth at bedtime as needed for  sleep.    Yes [provider]  Turmeric (QC TUMERIC COMPLEX) 500 MG CAPS Take 1 capsule by mouth daily.   Yes [provider]  valACYclovir (VALTREX) 1000 MG tablet Take 500 mg by mouth daily as needed (flare ups).   Yes [provider]  zolpidem (AMBIEN) 10 MG tablet Take 10 mg by mouth at bedtime as needed for sleep.    Yes [provider]    Allergies    Patient has no known allergies.  Review of Systems   Review of Systems  Constitutional: Positive for activity change.  Respiratory: Negative for shortness of breath.   Cardiovascular: Negative for chest pain.  Musculoskeletal: Positive for arthralgias.  Hematological: Does not bruise/bleed easily.  All other systems reviewed and are negative.   Physical Exam Updated Vital Signs BP 134/71   Pulse (!) 112   Temp 98.2 F (36.8 C) (Oral)   Resp 20   Ht 5\' 7"  (1.702 m)   Wt 79.4 kg   SpO2 100%   BMI 27.41 kg/m   Physical Exam Vitals and nursing note reviewed.  Constitutional:      Appearance: She is well-developed.  HENT:     Head: Normocephalic and atraumatic.  Eyes:     Pupils: Pupils are equal, round, and reactive to light.  Cardiovascular:     Rate and Rhythm: Normal rate and regular rhythm.     Heart sounds: Normal heart sounds. No murmur.  Pulmonary:     Effort: Pulmonary effort is normal. No respiratory distress.  Abdominal:     General: There is no distension.     Palpations: Abdomen is soft.     Tenderness: There is no abdominal tenderness. There is no guarding or rebound.  Musculoskeletal:     Cervical back: Neck supple.     Comments: Patient has tenderness over the right pelvis.  Extremity shortened.  Head to toe evaluation shows no hematoma, bleeding of the scalp, no facial abrasions, no spine step offs, crepitus of the chest or neck, no tenderness to palpation of the bilateral upper and lower extremities, no gross deformities, no chest tenderness, no pelvic pain.    Skin:    General: Skin is warm and dry.  Neurological:     Mental Status: She is alert and oriented to person, place, and time.     ED Results / Procedures / Treatments   Labs (all labs ordered are listed, but only abnormal results are displayed) Labs Reviewed  BASIC METABOLIC PANEL - Abnormal; Notable for the following components:      Result Value   Glucose, Bld 119 (*)    BUN 29 (*)    All other components within normal limits  CBC WITH DIFFERENTIAL/PLATELET - Abnormal; Notable for the following components:   Hemoglobin 10.9 (*)    HCT 35.2 (*)    RDW 17.2 (*)    Neutro Abs 7.9 (*)    All other components within normal limits  SARS CORONAVIRUS 2 (TAT 6-24 HRS)  PROTIME-INR  HIV ANTIBODY (ROUTINE TESTING W REFLEX)  TYPE AND SCREEN    EKG None  Radiology DG Chest 1 View  Result Date: 07/08/2019 CLINICAL DATA:  Pt presents from home after she had a fall. Pt was distracted and fell onto her right side. Pt believes that she has a hip fracture. Pt a/o x 4. Pre op for right hip fracture. No chest complaints. EXAM: CHEST  1 VIEW COMPARISON:  None. FINDINGS: The heart size and mediastinal contours are within normal limits. The lungs are clear. No pneumothorax or large pleural effusion. The visualized skeletal structures are unremarkable. IMPRESSION: No active cardiopulmonary disease. Electronically Signed   By: Emmaline Kluver M.D.   On: 07/08/2019 19:24   DG Hip Unilat  With Pelvis 2-3 Views Right  Result Date: 07/08/2019 CLINICAL DATA:  Initial evaluation for acute trauma, fall. Right hip pain. EXAM: DG HIP (WITH OR WITHOUT PELVIS) 2-3V RIGHT COMPARISON:  None. FINDINGS: Subtle cortical disruption with linear lucency seen extending through the medial aspect of the right femoral neck, consistent with acute nondisplaced right femoral neck fracture. Femoral head remains normally position within the acetabulum. Bony pelvis intact. Limited views of the left hip unremarkable.  Degenerative spondylosis noted within the lower lumbar spine. No acute soft tissue abnormality. IMPRESSION: Acute nondisplaced fracture involving the right femoral neck. Electronically Signed   By: Rise Mu M.D.   On: 07/08/2019 19:26    Procedures Procedures (including critical care time)  Medications Ordered in ED Medications  fentaNYL (SUBLIMAZE) injection 50 mcg (50 mcg Intravenous Given 07/08/19 2019)  albuterol (VENTOLIN HFA) 108 (90 Base) MCG/ACT inhaler 2 puff (has no administration in time range)  sertraline (ZOLOFT) tablet 25 mg (has no administration in time range)  traZODone (DESYREL) tablet 50 mg (has no administration in time range)  magnesium oxide (MAG-OX) tablet 200 mg (has no administration in time range)  HYDROcodone-acetaminophen (NORCO/VICODIN) 5-325 MG per tablet 1-2 tablet (has no administration in time range)  morphine 2 MG/ML injection 0.5 mg (has no administration in time range)  fentaNYL (SUBLIMAZE) injection 50 mcg (50 mcg Intravenous Given 07/08/19 1905)    ED Course  I have reviewed the triage vital signs and the nursing notes.  Pertinent labs & imaging results that were available during my care of the patient were reviewed by me and considered in my medical decision making (see chart for details).    MDM Rules/Calculators/A&P                       DDx includes: - Mechanical falls - ICH - Fractures - Contusions - Soft tissue injury  Patient comes in a chief complaint of fall. She is having right hip pain and is noted to have right femoral neck fracture.  Head and C-spine cleared clinically.  Patient has no red flags suggesting elevated ICP or spinal cord disease.  She has no neck tenderness.  Fall was for hours prior to my assessment.  Patient is comfortable with clearing her brain and C-spine clinically.  I discussed the case with Dr. Carola Frost, who is requesting admission by hospitalist.  Final Clinical Impression(s) / ED  Diagnoses Final diagnoses:  Closed fracture  of neck of right femur, initial encounter [facility])    Rx / DC Orders ED Discharge Orders    None       Derwood Kaplan, MD 07/08/19 2101

## 2019-07-08 NOTE — ED Triage Notes (Signed)
Pt presents from home after she had a fall.  Pt was distracted and fell onto her right side.  Pt believes that she has a hip fracture. Pt a/o x 4.

## 2019-07-08 NOTE — H&P (Signed)
History and Physical    [facility] OQH:[id number] DOB: 1956-06-10 DOA: 07/08/2019  PCP: Blair Heys, MD  Patient coming from: Home  I have personally briefly reviewed patient's old medical records in [facility]  Chief Complaint: Fall, hip pain  HPI: [facility] is a 63 y.o. female with medical history significant of few medical problems.  Presents to ED with fall.  Was walking dog, became distracted, had mechanical fall.  Got up and walked home but having progressively worsening R hip pain so came in to ED.   ED Course: R hip minimally displaced femoral neck fx.  No recent CP, SOB on activity, etc.   Review of Systems: As per HPI, otherwise all review of systems negative.  Past Medical History:  Diagnosis Date  . Carpal tunnel syndrome of right wrist 05/2013  . Constipation    occasional  . GERD (gastroesophageal reflux disease)    OTC as needed  . Headache(784.0)    sinus  . History of colitis   . History of palpitations    under control with Metoprolol  . PMDD (premenstrual dysphoric disorder)    takes HCTZ to control sx.  . Seasonal allergies     Past Surgical History:  Procedure Laterality Date  . CARPAL TUNNEL RELEASE Right 06/14/2013   Procedure: CARPAL TUNNEL RELEASE; RIGHT ;  Surgeon: Loreta Ave, MD;  Location: [facility];  Service: Orthopedics;  Laterality: Right;  . ELBOW ARTHROSCOPY Left 02/08/2013   Procedure: LEFT ELBOW ARTHROSCOPY WITH DEBRIDEMENT LIMITED/ TENNIS ELBOW  REPAIR, PARTIAL SYNOVECTOMY AND CHONDROPLASTY;  Surgeon: Loreta Ave, MD;  Location: [facility];  Service: Orthopedics;  Laterality: Left;  . KNEE ARTHROSCOPY W/ ACL RECONSTRUCTION Left 02/18/2011   x 2  . WRIST SURGERY Left 05/26/2012   radial nerve decompression     reports that she quit smoking about 42 years ago. She has never used smokeless tobacco. She reports current alcohol use. She reports that she does not use drugs.  No  Known Allergies  No family history on file. No sick contacts.  Prior to Admission medications   Medication Sig Start Date End Date Taking? Authorizing Provider  albuterol (PROVENTIL HFA;VENTOLIN HFA) 108 (90 Base) MCG/ACT inhaler Inhale 2 puffs into the lungs every 6 (six) hours as needed for wheezing or shortness of breath. 04/28/18  Yes Nahser, Deloris Ping, MD  Ascorbic Acid (VITAMIN C PO) Take 1 tablet by mouth daily.    Yes [provider]  CA-MG-VIT D-L METHYLFOL-B6-B12 PO Take 1 tablet by mouth daily.    Yes [provider]  Coenzyme Q10 10 MG capsule Take 1 capsule by mouth daily.   Yes [provider]  lidocaine (XYLOCAINE) 5 % ointment Apply 1 application topically 4 (four) times daily as needed for pain. 02/08/19  Yes [provider]  LYSINE PO Take 1 mg by mouth daily.    Yes [provider]  magnesium 30 MG tablet Take 30 mg by mouth daily.    Yes [provider]  Menaquinone-7 (VITAMIN K2) 100 MCG CAPS Take 100 mcg by mouth daily.   Yes [provider]  Misc Natural Products (OSTEO BI-FLEX ADV JOINT SHIELD PO) Take 1 tablet by mouth daily.    Yes [provider]  Multiple Vitamins-Minerals (ZINC PO) Take 1 tablet by mouth daily.    Yes [provider]  Probiotic Product (PROBIOTIC FORMULA PO) Take 1 tablet by mouth daily.  Yes [provider]  sertraline (ZOLOFT) 50 MG tablet Take 25 mg by mouth daily.    Yes [provider]  traZODone (DESYREL) 50 MG tablet Take 50 mg by mouth at bedtime as needed for sleep.    Yes [provider]  Turmeric (QC TUMERIC COMPLEX) 500 MG CAPS Take 1 capsule by mouth daily.   Yes [provider]  valACYclovir (VALTREX) 1000 MG tablet Take 500 mg by mouth daily as needed (flare ups).   Yes [provider]  zolpidem (AMBIEN) 10 MG tablet Take 10 mg by mouth at bedtime as needed for sleep.    Yes [provider]     Physical Exam: Vitals:   07/08/19 1837 07/08/19 1844 07/08/19 2011 07/08/19 2012  BP: 132/80  134/71   Pulse: 86   (!) 112  Resp: 18   20  Temp: 98.2 F (36.8 C)     TempSrc: Oral     SpO2: 99%   100%  Weight:  79.4 kg    Height:   (1.702 m)      Constitutional: NAD, calm, comfortable Eyes: PERRL, lids and conjunctivae normal ENMT: Mucous membranes are moist. Posterior pharynx clear of any exudate or lesions.Normal dentition.  Neck: normal, supple, no masses, no thyromegaly Respiratory: clear to auscultation bilaterally, no wheezing, no crackles. Normal respiratory effort. No accessory muscle use.  Cardiovascular: Regular rate and rhythm, no murmurs / rubs / gallops. No extremity edema. 2+ pedal pulses. No carotid bruits.  Abdomen: no tenderness, no masses palpated. No hepatosplenomegaly. Bowel sounds positive.  Musculoskeletal: r hip TTP. Skin: no rashes, lesions, ulcers. No induration Neurologic: CN 2-12 grossly intact. Sensation intact, DTR normal. Strength 5/5 in all 4.  Psychiatric: Normal judgment and insight. Alert and oriented x 3. Normal mood.    Labs on Admission: I have personally reviewed following labs and imaging studies  CBC: Recent Labs  Lab 07/08/19 1909  WBC 10.0  NEUTROABS 7.9*  HGB 10.9*  HCT 35.2*  MCV 85.6  PLT 322   Basic Metabolic Panel: Recent Labs  Lab 07/08/19 1909  NA 138  K 3.7  CL 103  CO2 24  GLUCOSE 119*  BUN 29*  CREATININE 1.00  CALCIUM 9.2   GFR: Estimated Creatinine Clearance: 63.3 mL/min (by C-G formula based on SCr of 1 mg/dL). Liver Function Tests: No results for input(s): AST, ALT, ALKPHOS, BILITOT, PROT, ALBUMIN in the last 168 hours. No results for input(s): LIPASE, AMYLASE in the last 168 hours. No results for input(s): AMMONIA in the last 168 hours. Coagulation Profile: Recent Labs  Lab 07/08/19 1909  INR 0.9   Cardiac Enzymes: No results for input(s): CKTOTAL, CKMB, CKMBINDEX, TROPONINI in the  last 168 hours. BNP (last 3 results) No results for input(s): PROBNP in the last 8760 hours. HbA1C: No results for input(s): HGBA1C in the last 72 hours. CBG: No results for input(s): GLUCAP in the last 168 hours. Lipid Profile: No results for input(s): CHOL, HDL, LDLCALC, TRIG, CHOLHDL, LDLDIRECT in the last 72 hours. Thyroid Function Tests: No results for input(s): TSH, T4TOTAL, FREET4, T3FREE, THYROIDAB in the last 72 hours. Anemia Panel: No results for input(s): VITAMINB12, FOLATE, FERRITIN, TIBC, IRON, RETICCTPCT in the last 72 hours. Urine analysis: No results found for: COLORURINE, APPEARANCEUR, LABSPEC, PHURINE, GLUCOSEU, HGBUR, BILIRUBINUR, KETONESUR, PROTEINUR, UROBILINOGEN, NITRITE, LEUKOCYTESUR  Radiological Exams on Admission: DG Chest 1 View  Result Date: 07/08/2019 CLINICAL DATA:  Pt presents from home after she had a fall.  Pt was distracted and fell onto her right side. Pt believes that she has a hip fracture. Pt a/o x 4. Pre op for right hip fracture. No chest complaints. EXAM: CHEST  1 VIEW COMPARISON:  None. FINDINGS: The heart size and mediastinal contours are within normal limits. The lungs are clear. No pneumothorax or large pleural effusion. The visualized skeletal structures are unremarkable. IMPRESSION: No active cardiopulmonary disease. Electronically Signed   By: Emmaline KluverNancy  Ballantyne M.D.   On: 07/08/2019 19:24   DG Hip Unilat  With Pelvis 2-3 Views Right  Result Date: 07/08/2019 CLINICAL DATA:  Initial evaluation for acute trauma, fall. Right hip pain. EXAM: DG HIP (WITH OR WITHOUT PELVIS) 2-3V RIGHT COMPARISON:  None. FINDINGS: Subtle cortical disruption with linear lucency seen extending through the medial aspect of the right femoral neck, consistent with acute nondisplaced right femoral neck fracture. Femoral head remains normally position within the acetabulum. Bony pelvis intact. Limited views of the left hip unremarkable. Degenerative spondylosis noted within  the lower lumbar spine. No acute soft tissue abnormality. IMPRESSION: Acute nondisplaced fracture involving the right femoral neck. Electronically Signed   By: Rise MuBenjamin  McClintock M.D.   On: 07/08/2019 19:26    EKG: Independently reviewed.  Assessment/Plan Principal Problem:   Fracture of femoral neck, right, closed (HCC)    1. R femoral neck fx - 1. Hip fx pathway 2. NPO after MN 3. Pain ctrl per pathway 4. CXR neg, EKG pending, but it sounds like she is low risk for surgery. 5. To OR tomorrow for operative repair  DVT prophylaxis: SCDs Code Status: Full Family Communication: Family at bedside Disposition Plan: TBD Consults called: Dr. Carola FrostHandy Admission status: Admit to inpatient  Severity of Illness: The appropriate patient status for this patient is INPATIENT. Inpatient status is judged to be reasonable and necessary in order to provide the required intensity of service to ensure the patient's safety. The patient's presenting symptoms, physical exam findings, and initial radiographic and laboratory data in the context of their chronic comorbidities is felt to place them at high risk for further clinical deterioration. Furthermore, it is not anticipated that the patient will be medically stable for discharge from the hospital within 2 midnights of admission. The following factors support the patient status of inpatient.   IP status for hip fx requiring operative repair.  * I certify that at the point of admission it is my clinical judgment that the patient will require inpatient hospital care spanning beyond 2 midnights from the point of admission due to high intensity of service, high risk for further deterioration and high frequency of surveillance required.*    Alok Minshall M. DO [facility]  How to contact the [facility] Attending or Consulting provider 7A - 7P or covering provider during after hours 7P -7A, for this patient?  1. Check the care team in [facility] and look for a)  attending/consulting [facility] provider listed and b) the [facility] team listed 2. Log into [email or web address]  Amion Physician Scheduling and messaging for groups and whole hospitals  On call and physician scheduling software for group practices, residents, hospitalists and other medical providers for call, clinic, rotation and shift schedules. OnCall [employer] is a hospital-wide system for scheduling doctors and paging doctors on call. EasyPlot is for scientific plotting and data analysis.  [email or web address]  and use [place]'s universal password to access. If you do not have the password, please contact the hospital operator.  3. Locate the [facility] provider you are looking for under [facility] and page to a number that you can be directly reached. 4. If you still have difficulty reaching the provider, please page the [facility] (Director on Call) for the Hospitalists listed on amion for assistance.  07/08/2019, 8:59 PM

## 2019-07-09 ENCOUNTER — Inpatient Hospital Stay (HOSPITAL_COMMUNITY): Payer: BC Managed Care – PPO | Admitting: Anesthesiology

## 2019-07-09 ENCOUNTER — Inpatient Hospital Stay (HOSPITAL_COMMUNITY): Payer: BC Managed Care – PPO

## 2019-07-09 ENCOUNTER — Encounter (HOSPITAL_COMMUNITY)
Admission: EM | Disposition: A | Discharge status: Home / Self Care | Payer: Self-pay | Source: Home / Self Care | Attending: Internal Medicine

## 2019-07-09 ENCOUNTER — Encounter (HOSPITAL_COMMUNITY): Payer: Self-pay | Admitting: Internal Medicine

## 2019-07-09 DIAGNOSIS — S72001A Fracture of unspecified part of neck of right femur, initial encounter for closed fracture: Secondary | ICD-10-CM | POA: Diagnosis not present

## 2019-07-09 DIAGNOSIS — M25551 Pain in right hip: Secondary | ICD-10-CM | POA: Diagnosis not present

## 2019-07-09 DIAGNOSIS — E785 Hyperlipidemia, unspecified: Secondary | ICD-10-CM | POA: Diagnosis not present

## 2019-07-09 DIAGNOSIS — S72091D Other fracture of head and neck of right femur, subsequent encounter for closed fracture with routine healing: Secondary | ICD-10-CM | POA: Diagnosis not present

## 2019-07-09 DIAGNOSIS — F329 Major depressive disorder, single episode, unspecified: Secondary | ICD-10-CM | POA: Diagnosis not present

## 2019-07-09 DIAGNOSIS — K219 Gastro-esophageal reflux disease without esophagitis: Secondary | ICD-10-CM | POA: Diagnosis not present

## 2019-07-09 DIAGNOSIS — S72011A Unspecified intracapsular fracture of right femur, initial encounter for closed fracture: Secondary | ICD-10-CM | POA: Diagnosis not present

## 2019-07-09 HISTORY — PX: HIP PINNING,CANNULATED: SHX1758

## 2019-07-09 LAB — CBC
HCT: 35.2 % — ABNORMAL LOW (ref 36.0–46.0)
Hemoglobin: 10.8 g/dL — ABNORMAL LOW (ref 12.0–15.0)
MCH: 26.2 pg (ref 26.0–34.0)
MCHC: 30.7 g/dL (ref 30.0–36.0)
MCV: 85.2 fL (ref 80.0–100.0)
Platelets: 287 10*3/uL (ref 150–400)
RBC: 4.13 MIL/uL (ref 3.87–5.11)
RDW: 17.3 % — ABNORMAL HIGH (ref 11.5–15.5)
WBC: 6.8 10*3/uL (ref 4.0–10.5)
nRBC: 0 % (ref 0.0–0.2)

## 2019-07-09 LAB — PROTIME-INR
INR: 1 (ref 0.8–1.2)
Prothrombin Time: 13.3 seconds (ref 11.4–15.2)

## 2019-07-09 LAB — VITAMIN D 25 HYDROXY (VIT D DEFICIENCY, FRACTURES): Vit D, 25-Hydroxy: 87.21 ng/mL (ref 30–100)

## 2019-07-09 LAB — COMPREHENSIVE METABOLIC PANEL
ALT: 19 U/L (ref 0–44)
AST: 24 U/L (ref 15–41)
Albumin: 3.9 g/dL (ref 3.5–5.0)
Alkaline Phosphatase: 52 U/L (ref 38–126)
Anion gap: 9 (ref 5–15)
BUN: 20 mg/dL (ref 8–23)
CO2: 27 mmol/L (ref 22–32)
Calcium: 8.9 mg/dL (ref 8.9–10.3)
Chloride: 104 mmol/L (ref 98–111)
Creatinine, Ser: 0.83 mg/dL (ref 0.44–1.00)
GFR calc Af Amer: >60 mL/min (ref >60)
GFR calc non Af Amer: >60 mL/min (ref >60)
Glucose, Bld: 101 mg/dL — ABNORMAL HIGH (ref 70–99)
Potassium: 3.8 mmol/L (ref 3.5–5.1)
Sodium: 140 mmol/L (ref 135–145)
Total Bilirubin: 1.1 mg/dL (ref 0.3–1.2)
Total Protein: 6.6 g/dL (ref 6.5–8.1)

## 2019-07-09 LAB — TYPE AND SCREEN
ABO/RH(D): B POS
Antibody Screen: NEGATIVE

## 2019-07-09 LAB — SARS CORONAVIRUS 2 (TAT 6-24 HRS): SARS Coronavirus 2: NEGATIVE

## 2019-07-09 LAB — HIV ANTIBODY (ROUTINE TESTING W REFLEX): HIV Screen 4th Generation wRfx: NONREACTIVE

## 2019-07-09 LAB — MRSA PCR SCREENING: MRSA by PCR: NEGATIVE

## 2019-07-09 LAB — ABO/RH: ABO/RH(D): B POS

## 2019-07-09 SURGERY — FIXATION, FEMUR, NECK, PERCUTANEOUS, USING SCREW
Anesthesia: General | Site: Hip | Laterality: Right

## 2019-07-09 MED ORDER — ACETAMINOPHEN 325 MG PO TABS: 325.0000 mg | ORAL_TABLET | Freq: Four times a day (QID) | ORAL | Status: DC | PRN

## 2019-07-09 MED ORDER — SUGAMMADEX SODIUM 500 MG/5ML IV SOLN
INTRAVENOUS | Status: AC
  Filled 2019-07-09: qty 5

## 2019-07-09 MED ORDER — METHOCARBAMOL 1000 MG/10ML IJ SOLN
500.0000 mg | Freq: Four times a day (QID) | INTRAVENOUS | Status: DC | PRN
  Administered 2019-07-09: 500 mg via INTRAVENOUS
  Filled 2019-07-09: qty 5
  Filled 2019-07-09: qty 500

## 2019-07-09 MED ORDER — CEFAZOLIN SODIUM-DEXTROSE 2-4 GM/100ML-% IV SOLN
2.0000 g | INTRAVENOUS | Status: DC
  Filled 2019-07-09: qty 100

## 2019-07-09 MED ORDER — BUPIVACAINE HCL (PF) 0.25 % IJ SOLN
INTRAMUSCULAR | Status: AC
  Filled 2019-07-09: qty 30

## 2019-07-09 MED ORDER — LIDOCAINE 2% (20 MG/ML) 5 ML SYRINGE
INTRAMUSCULAR | Status: DC | PRN
  Administered 2019-07-09: 60 mg via INTRAVENOUS

## 2019-07-09 MED ORDER — DOCUSATE SODIUM 100 MG PO CAPS
100.0000 mg | ORAL_CAPSULE | Freq: Two times a day (BID) | ORAL | Status: DC
  Administered 2019-07-09 – 2019-07-10 (×2): 100 mg via ORAL
  Filled 2019-07-09 (×2): qty 1

## 2019-07-09 MED ORDER — CHLORHEXIDINE GLUCONATE 4 % EX LIQD: 60.0000 mL | Freq: Once | CUTANEOUS | Status: DC

## 2019-07-09 MED ORDER — ENOXAPARIN SODIUM 40 MG/0.4ML ~~LOC~~ SOLN
40.0000 mg | SUBCUTANEOUS | Status: DC
  Administered 2019-07-10: 40 mg via SUBCUTANEOUS
  Filled 2019-07-09: qty 0.4

## 2019-07-09 MED ORDER — MIDAZOLAM HCL 2 MG/2ML IJ SOLN
INTRAMUSCULAR | Status: DC | PRN
  Administered 2019-07-09 (×2): 1 mg via INTRAVENOUS

## 2019-07-09 MED ORDER — ALUM & MAG HYDROXIDE-SIMETH 200-200-20 MG/5ML PO SUSP: 30.0000 mL | ORAL | Status: DC | PRN

## 2019-07-09 MED ORDER — ROCURONIUM BROMIDE 10 MG/ML (PF) SYRINGE
PREFILLED_SYRINGE | INTRAVENOUS | Status: DC | PRN
  Administered 2019-07-09: 40 mg via INTRAVENOUS

## 2019-07-09 MED ORDER — OXYCODONE HCL 5 MG PO TABS: 5.0000 mg | ORAL_TABLET | Freq: Once | ORAL | Status: AC | PRN

## 2019-07-09 MED ORDER — FENTANYL CITRATE (PF) 250 MCG/5ML IJ SOLN
INTRAMUSCULAR | Status: DC | PRN
  Administered 2019-07-09: 50 ug via INTRAVENOUS
  Administered 2019-07-09 (×2): 100 ug via INTRAVENOUS

## 2019-07-09 MED ORDER — LACTATED RINGERS IV SOLN
INTRAVENOUS | Status: DC
  Administered 2019-07-09 (×2): via INTRAVENOUS

## 2019-07-09 MED ORDER — FENTANYL CITRATE (PF) 100 MCG/2ML IJ SOLN
INTRAMUSCULAR | Status: DC | PRN
  Administered 2019-07-09 (×4): 50 ug via INTRAVENOUS

## 2019-07-09 MED ORDER — KETOROLAC TROMETHAMINE 15 MG/ML IJ SOLN
7.5000 mg | Freq: Four times a day (QID) | INTRAMUSCULAR | Status: DC
  Administered 2019-07-09 – 2019-07-10 (×3): 7.5 mg via INTRAVENOUS
  Filled 2019-07-09 (×4): qty 1

## 2019-07-09 MED ORDER — ACETAMINOPHEN 500 MG PO TABS
500.0000 mg | ORAL_TABLET | Freq: Four times a day (QID) | ORAL | Status: DC
  Administered 2019-07-09 – 2019-07-10 (×3): 500 mg via ORAL
  Filled 2019-07-09 (×4): qty 1

## 2019-07-09 MED ORDER — ONDANSETRON HCL 4 MG PO TABS: 4.0000 mg | ORAL_TABLET | Freq: Four times a day (QID) | ORAL | Status: DC | PRN

## 2019-07-09 MED ORDER — HYDROCODONE-ACETAMINOPHEN 7.5-325 MG PO TABS: 1.0000 | ORAL_TABLET | ORAL | Status: DC | PRN

## 2019-07-09 MED ORDER — ONDANSETRON HCL 4 MG/2ML IJ SOLN
INTRAMUSCULAR | Status: DC | PRN
  Administered 2019-07-09: 4 mg via INTRAVENOUS

## 2019-07-09 MED ORDER — PHENOL 1.4 % MT LIQD
1.0000 | OROMUCOSAL | Status: DC | PRN
  Filled 2019-07-09: qty 177

## 2019-07-09 MED ORDER — LIDOCAINE 5 % EX OINT
1.0000 "application " | TOPICAL_OINTMENT | Freq: Four times a day (QID) | CUTANEOUS | Status: DC | PRN
  Filled 2019-07-09: qty 35.44

## 2019-07-09 MED ORDER — ADULT MULTIVITAMIN W/MINERALS CH
1.0000 | ORAL_TABLET | Freq: Every day | ORAL | Status: DC
  Administered 2019-07-10: 1 via ORAL
  Filled 2019-07-09 (×2): qty 1

## 2019-07-09 MED ORDER — PROPOFOL 10 MG/ML IV BOLUS
INTRAVENOUS | Status: DC | PRN
  Administered 2019-07-09: 150 mg via INTRAVENOUS

## 2019-07-09 MED ORDER — FENTANYL CITRATE (PF) 250 MCG/5ML IJ SOLN
INTRAMUSCULAR | Status: AC
  Filled 2019-07-09: qty 5

## 2019-07-09 MED ORDER — FENTANYL CITRATE (PF) 100 MCG/2ML IJ SOLN
INTRAMUSCULAR | Status: AC
  Filled 2019-07-09: qty 2

## 2019-07-09 MED ORDER — ENSURE ENLIVE PO LIQD
237.0000 mL | Freq: Two times a day (BID) | ORAL | Status: DC
  Administered 2019-07-10 (×2): 237 mL via ORAL
  Filled 2019-07-09: qty 237

## 2019-07-09 MED ORDER — MORPHINE SULFATE (PF) 2 MG/ML IV SOLN: 0.5000 mg | INTRAVENOUS | Status: DC | PRN

## 2019-07-09 MED ORDER — MIDAZOLAM HCL 2 MG/2ML IJ SOLN
INTRAMUSCULAR | Status: AC
  Filled 2019-07-09: qty 2

## 2019-07-09 MED ORDER — ONDANSETRON HCL 4 MG/2ML IJ SOLN
INTRAMUSCULAR | Status: AC
  Filled 2019-07-09: qty 2

## 2019-07-09 MED ORDER — SUGAMMADEX SODIUM 500 MG/5ML IV SOLN
INTRAVENOUS | Status: DC | PRN
  Administered 2019-07-09: 300 mg via INTRAVENOUS

## 2019-07-09 MED ORDER — MENTHOL 3 MG MT LOZG: 1.0000 | LOZENGE | OROMUCOSAL | Status: DC | PRN

## 2019-07-09 MED ORDER — POTASSIUM CHLORIDE IN NACL 20-0.45 MEQ/L-% IV SOLN
INTRAVENOUS | Status: DC
  Administered 2019-07-09: 20:00:00 via INTRAVENOUS
  Filled 2019-07-09 (×2): qty 1000

## 2019-07-09 MED ORDER — FERROUS SULFATE 325 (65 FE) MG PO TABS
325.0000 mg | ORAL_TABLET | Freq: Three times a day (TID) | ORAL | Status: DC
  Administered 2019-07-09 – 2019-07-10 (×3): 325 mg via ORAL
  Filled 2019-07-09 (×3): qty 1

## 2019-07-09 MED ORDER — FENTANYL CITRATE (PF) 100 MCG/2ML IJ SOLN
INTRAMUSCULAR | Status: AC
  Administered 2019-07-09: 50 ug via INTRAVENOUS
  Filled 2019-07-09: qty 2

## 2019-07-09 MED ORDER — PROPOFOL 10 MG/ML IV BOLUS
INTRAVENOUS | Status: AC
  Filled 2019-07-09: qty 20

## 2019-07-09 MED ORDER — POLYETHYLENE GLYCOL 3350 17 G PO PACK: 17.0000 g | PACK | Freq: Every day | ORAL | Status: DC | PRN

## 2019-07-09 MED ORDER — ONDANSETRON HCL 4 MG/2ML IJ SOLN
4.0000 mg | Freq: Four times a day (QID) | INTRAMUSCULAR | Status: DC | PRN
  Administered 2019-07-09: 4 mg via INTRAVENOUS
  Filled 2019-07-09: qty 2

## 2019-07-09 MED ORDER — SUCCINYLCHOLINE CHLORIDE 200 MG/10ML IV SOSY
PREFILLED_SYRINGE | INTRAVENOUS | Status: DC | PRN
  Administered 2019-07-09: 100 mg via INTRAVENOUS

## 2019-07-09 MED ORDER — OXYCODONE HCL 5 MG PO TABS
ORAL_TABLET | ORAL | Status: AC
  Administered 2019-07-09: 5 mg via ORAL
  Filled 2019-07-09: qty 1

## 2019-07-09 MED ORDER — EPHEDRINE 5 MG/ML INJ
INTRAVENOUS | Status: AC
  Filled 2019-07-09: qty 10

## 2019-07-09 MED ORDER — MAGNESIUM CITRATE PO SOLN: 1.0000 | Freq: Once | ORAL | Status: DC | PRN

## 2019-07-09 MED ORDER — SENNA 8.6 MG PO TABS
1.0000 | ORAL_TABLET | Freq: Two times a day (BID) | ORAL | Status: DC
  Administered 2019-07-09 – 2019-07-10 (×2): 8.6 mg via ORAL
  Filled 2019-07-09 (×2): qty 1

## 2019-07-09 MED ORDER — BUPIVACAINE HCL (PF) 0.25 % IJ SOLN
INTRAMUSCULAR | Status: DC | PRN
  Administered 2019-07-09: 20 mL via INTRA_ARTICULAR

## 2019-07-09 MED ORDER — ONDANSETRON HCL 4 MG/2ML IJ SOLN: 4.0000 mg | Freq: Four times a day (QID) | INTRAMUSCULAR | Status: DC | PRN

## 2019-07-09 MED ORDER — CEFAZOLIN SODIUM-DEXTROSE 2-4 GM/100ML-% IV SOLN
2.0000 g | Freq: Four times a day (QID) | INTRAVENOUS | Status: AC
  Administered 2019-07-09 – 2019-07-10 (×2): 2 g via INTRAVENOUS
  Filled 2019-07-09 (×2): qty 100

## 2019-07-09 MED ORDER — OXYCODONE HCL 5 MG/5ML PO SOLN: 5.0000 mg | Freq: Once | ORAL | Status: AC | PRN

## 2019-07-09 MED ORDER — EPHEDRINE SULFATE-NACL 50-0.9 MG/10ML-% IV SOSY
PREFILLED_SYRINGE | INTRAVENOUS | Status: DC | PRN
  Administered 2019-07-09: 10 mg via INTRAVENOUS

## 2019-07-09 MED ORDER — ACETAMINOPHEN 500 MG PO TABS
1000.0000 mg | ORAL_TABLET | Freq: Once | ORAL | Status: AC
  Administered 2019-07-09: 1000 mg via ORAL
  Filled 2019-07-09: qty 2

## 2019-07-09 MED ORDER — FENTANYL CITRATE (PF) 100 MCG/2ML IJ SOLN
25.0000 ug | INTRAMUSCULAR | Status: DC | PRN
  Administered 2019-07-09 (×2): 50 ug via INTRAVENOUS

## 2019-07-09 MED ORDER — PHENYLEPHRINE 40 MCG/ML (10ML) SYRINGE FOR IV PUSH (FOR BLOOD PRESSURE SUPPORT)
PREFILLED_SYRINGE | INTRAVENOUS | Status: DC | PRN
  Administered 2019-07-09 (×2): 120 ug via INTRAVENOUS
  Administered 2019-07-09: 80 ug via INTRAVENOUS
  Administered 2019-07-09: 120 ug via INTRAVENOUS
  Administered 2019-07-09 (×3): 80 ug via INTRAVENOUS

## 2019-07-09 MED ORDER — 0.9 % SODIUM CHLORIDE (POUR BTL) OPTIME
TOPICAL | Status: DC | PRN
  Administered 2019-07-09: 17:00:00 1000 mL

## 2019-07-09 MED ORDER — BISACODYL 10 MG RE SUPP: 10.0000 mg | Freq: Every day | RECTAL | Status: DC | PRN

## 2019-07-09 MED ORDER — POVIDONE-IODINE 10 % EX SWAB
2.0000 "application " | Freq: Once | CUTANEOUS | Status: AC
  Administered 2019-07-09: 2 via TOPICAL

## 2019-07-09 MED ORDER — HYDROCODONE-ACETAMINOPHEN 5-325 MG PO TABS: 1.0000 | ORAL_TABLET | ORAL | Status: DC | PRN

## 2019-07-09 MED ORDER — DEXAMETHASONE SODIUM PHOSPHATE 10 MG/ML IJ SOLN
INTRAMUSCULAR | Status: DC | PRN
  Administered 2019-07-09: 10 mg via INTRAVENOUS

## 2019-07-09 MED ORDER — DEXAMETHASONE SODIUM PHOSPHATE 10 MG/ML IJ SOLN
INTRAMUSCULAR | Status: AC
  Filled 2019-07-09: qty 1

## 2019-07-09 MED ORDER — PHENYLEPHRINE 40 MCG/ML (10ML) SYRINGE FOR IV PUSH (FOR BLOOD PRESSURE SUPPORT)
PREFILLED_SYRINGE | INTRAVENOUS | Status: AC
  Filled 2019-07-09: qty 10

## 2019-07-09 SURGICAL SUPPLY — 43 items
BAG SPEC THK2 15X12 ZIP CLS (MISCELLANEOUS) ×1
BAG ZIPLOCK 12X15 (MISCELLANEOUS) ×2 IMPLANT
BIT DRILL 5 ACE CANN QC (BIT) ×1 IMPLANT
BNDG COHESIVE 6X5 TAN STRL LF (GAUZE/BANDAGES/DRESSINGS) ×2 IMPLANT
COVER SURGICAL LIGHT HANDLE (MISCELLANEOUS) ×2 IMPLANT
COVER WAND RF STERILE (DRAPES) IMPLANT
DECANTER SPIKE VIAL GLASS SM (MISCELLANEOUS) ×2 IMPLANT
DRAPE STERI IOBAN 125X83 (DRAPES) ×2 IMPLANT
DRSG AQUACEL AG ADV 3.5X 4 (GAUZE/BANDAGES/DRESSINGS) ×4 IMPLANT
DRSG MEPILEX BORDER 4X4 (GAUZE/BANDAGES/DRESSINGS) ×1 IMPLANT
DURAPREP 26ML APPLICATOR (WOUND CARE) ×2 IMPLANT
ELECT REM PT RETURN 15FT ADLT (MISCELLANEOUS) ×2 IMPLANT
FACESHIELD WRAPAROUND (MASK) ×4 IMPLANT
FACESHIELD WRAPAROUND OR TEAM (MASK) ×2 IMPLANT
GLOVE BIOGEL PI IND STRL 8 (GLOVE) ×2 IMPLANT
GLOVE BIOGEL PI INDICATOR 8 (GLOVE) ×2
GLOVE ORTHO TXT STRL SZ7.5 (GLOVE) ×2 IMPLANT
GLOVE SURG SS PI 8.0 STRL IVOR (GLOVE) ×2 IMPLANT
GOWN STRL REUS W/TWL LRG LVL3 (GOWN DISPOSABLE) ×2 IMPLANT
KIT BASIN OR (CUSTOM PROCEDURE TRAY) ×2 IMPLANT
KIT TURNOVER KIT A (KITS) IMPLANT
NEEDLE HYPO 22GX1.5 SAFETY (NEEDLE) ×2 IMPLANT
NS IRRIG 1000ML POUR BTL (IV SOLUTION) ×2 IMPLANT
PACK GENERAL/GYN (CUSTOM PROCEDURE TRAY) ×2 IMPLANT
PENCIL SMOKE EVACUATOR (MISCELLANEOUS) IMPLANT
PIN THREADED GUIDE ACE (PIN) ×3 IMPLANT
PROTECTOR NERVE ULNAR (MISCELLANEOUS) ×2 IMPLANT
SCREW CANN 6.5 75MM (Screw) ×2 IMPLANT
SCREW CANN 6.5 80MM (Screw) ×6 IMPLANT
SCREW CANN 6.5 85MM (Screw) ×2 IMPLANT
SCREW CANN LG 6.5 FLT 75X22 (Screw) IMPLANT
SCREW CANN LG 6.5 FLT 80X22 (Screw) IMPLANT
SCREW CANN LG 6.5 FLT 85X22 (Screw) IMPLANT
STRIP CLOSURE SKIN 1/2X4 (GAUZE/BANDAGES/DRESSINGS) ×2 IMPLANT
SUT VIC AB 0 CT1 27 (SUTURE) ×2
SUT VIC AB 0 CT1 27XBRD ANTBC (SUTURE) ×1 IMPLANT
SUT VIC AB 3-0 SH 8-18 (SUTURE) ×2 IMPLANT
SYR 20ML LL LF (SYRINGE) ×2 IMPLANT
TOWEL OR 17X26 10 PK STRL BLUE (TOWEL DISPOSABLE) ×2 IMPLANT
TOWEL OR NON WOVEN STRL DISP B (DISPOSABLE) ×2 IMPLANT
TRAY FOLEY MTR SLVR 16FR STAT (SET/KITS/TRAYS/PACK) IMPLANT
WASHER ACECAN 6.5 (Washer) ×1 IMPLANT
WATER STERILE IRR 1000ML POUR (IV SOLUTION) ×2 IMPLANT

## 2019-07-09 NOTE — Op Note (Signed)
07/08/2019 - 07/09/2019  5:21 PM  PATIENT:  Erin [facility]    PRE-OPERATIVE DIAGNOSIS:  right femoral neck fracture, valgus impacted  POST-OPERATIVE DIAGNOSIS:  Same  PROCEDURE: RIGHT CANNULATED HIP PINNING  SURGEON:  Johnny Bridge, MD  PHYSICIAN ASSISTANT: Merlene Pulling, PA-C,  present and scrubbed throughout the case, critical for completion in a timely fashion, and for retraction, instrumentation, and closure.  ANESTHESIA:   General  ESTIMATED BLOOD LOSS: Minimal.  PREOPERATIVE INDICATIONS:  [name] is a  63 y.o. female who fell and was found to have a diagnosis of right femoral neck fracture who elected for surgical management.    The risks benefits and alternatives were discussed with the patient preoperatively including but not limited to the risks of infection, bleeding, nerve injury, cardiopulmonary complications, blood clots, malunion, nonunion, avascular necrosis, the need for revision surgery, the potential for conversion to hemiarthroplasty, among others, and the patient was willing to proceed.  OPERATIVE IMPLANTS: 6.5 mm cannulated screws x3 from [employer], titanium.  I used a washer on the inferior most screw.  OPERATIVE FINDINGS: Clinical osteoporosis with weak bone, proximal femur  OPERATIVE PROCEDURE: The patient was brought to the operating room and placed in supine position. IV antibiotics were given. General anesthesia administered. The patient was placed on the fracture table. The operative extremity was positioned, without any significant reduction maneuver and was prepped and draped in usual sterile fashion.  Time out was performed.  Small incision was made distal to the greater trochanter, and 3 guidewires were introduced Into an inverted triangle configuration. The lengths were measured. The reduction was slightly valgus, and near-anatomic. I opened the cortex with a cannulated drill, and then placed the screws into position. Satisfactory fixation was  achieved.  I did adjust the screws, 2 achieve appropriate length, switched 1 or 2 of the screws out for better length screws after initial implantation.  The wounds were irrigated copiously, and repaired with Vicryl with Steri-Strips and sterile gauze. There no complications and the patient tolerated the procedure well.  The patient will be weightbearing as tolerated, and will be on Lovenox for a period of 4 weeks after discharge for DVT prophylaxis.

## 2019-07-09 NOTE — Consult Note (Signed)
Reason for Consult:Right hip fx Referring Physician: Kathe MarinerJ Arnold  Erin MirzaMarina L Arnold is an 63 y.o. female.  HPI: Erin AdjutantMarina was walking her dog. They had stopped to let a truck pass and she thinks her knee gave way and she fell. She had immediate right hip pain but was able to get up and walk home. After taking a shower she decided she needed to be evaluated. She [facility] where x-rays showed a fem neck fx and orthopedic surgery was consulted. She works as a Producer, television/film/videocounselor but it's mostly virtual. She lives alone but has many neighbors who can help her.  Past Medical History:  Diagnosis Date  . Carpal tunnel syndrome of right wrist 05/2013  . Constipation    occasional  . GERD (gastroesophageal reflux disease)    OTC as needed  . Headache(784.0)    sinus  . History of colitis   . History of palpitations    under control with Metoprolol  . PMDD (premenstrual dysphoric disorder)    takes HCTZ to control sx.  . Seasonal allergies     Past Surgical History:  Procedure Laterality Date  . CARPAL TUNNEL RELEASE Right 06/14/2013   Procedure: CARPAL TUNNEL RELEASE; RIGHT ;  Surgeon: Loreta Aveaniel F Murphy, MD;  Location: [facility];  Service: Orthopedics;  Laterality: Right;  . ELBOW ARTHROSCOPY Left 02/08/2013   Procedure: LEFT ELBOW ARTHROSCOPY WITH DEBRIDEMENT LIMITED/ TENNIS ELBOW  REPAIR, PARTIAL SYNOVECTOMY AND CHONDROPLASTY;  Surgeon: Loreta Aveaniel F Murphy, MD;  Location: [facility];  Service: Orthopedics;  Laterality: Left;  . KNEE ARTHROSCOPY W/ ACL RECONSTRUCTION Left 02/18/2011   x 2  . WRIST SURGERY Left 05/26/2012   radial nerve decompression    No family history on file.  Social History:  reports that she quit smoking about 42 years ago. She has never used smokeless tobacco. She reports current alcohol use. She reports that she does not use drugs.  Allergies: No Known Allergies  Medications: I have reviewed the patient's current medications.  Results for orders  placed or performed during the hospital encounter of 07/08/19 (from the past 48 hour(s))  Basic metabolic panel     Status: Abnormal   Collection Time: 07/08/19  7:09 PM  Result Value Ref Range   Sodium 138 135 - 145 mmol/L   Potassium 3.7 3.5 - 5.1 mmol/L   Chloride 103 98 - 111 mmol/L   CO2 24 22 - 32 mmol/L   Glucose, Bld 119 (H) 70 - 99 mg/dL   BUN 29 (H) 8 - 23 mg/dL   Creatinine, Ser 5.631.00 0.44 - 1.00 mg/dL   Calcium 9.2 8.9 - 87.510.3 mg/dL   GFR calc non Af Amer >60 >60 mL/min   GFR calc Af Amer >60 >60 mL/min   Anion gap 11 5 - 15    Comment: Performed at [facility], [address] [place], [place] [id number]  CBC WITH DIFFERENTIAL     Status: Abnormal   Collection Time: 07/08/19  7:09 PM  Result Value Ref Range   WBC 10.0 4.0 - 10.5 K/uL   RBC 4.11 3.87 - 5.11 MIL/uL   Hemoglobin 10.9 (L) 12.0 - 15.0 g/dL   HCT 95.135.2 (L) 88.436.0 - 16.646.0 %   MCV 85.6 80.0 - 100.0 fL   MCH 26.5 26.0 - 34.0 pg   MCHC 31.0 30.0 - 36.0 g/dL   RDW 06.317.2 (H) 01.611.5 - 01.015.5 %   Platelets 322 150 - 400  K/uL   nRBC 0.0 0.0 - 0.2 %   Neutrophils Relative % 78 %   Neutro Abs 7.9 (H) 1.7 - 7.7 K/uL   Lymphocytes Relative 13 %   Lymphs Abs 1.3 0.7 - 4.0 K/uL   Monocytes Relative 6 %   Monocytes Absolute 0.6 0.1 - 1.0 K/uL   Eosinophils Relative 1 %   Eosinophils Absolute 0.1 0.0 - 0.5 K/uL   Basophils Relative 1 %   Basophils Absolute 0.1 0.0 - 0.1 K/uL   Immature Granulocytes 1 %   Abs Immature Granulocytes 0.05 0.00 - 0.07 K/uL    Comment: Performed at [facility], [place] [address] [place], [place] [address]  Protime-INR     Status: None   Collection Time: 07/08/19  7:09 PM  Result Value Ref Range   Prothrombin Time 12.5 11.4 - 15.2 seconds   INR 0.9 0.8 - 1.2    Comment: (NOTE) INR goal varies based on device and disease states. Performed at [facility], [place] [address] [place], [place] [address]   ABO/Rh     Status: None   Collection  Time: 07/08/19  8:10 PM  Result Value Ref Range   ABO/RH(D)      B POS Performed at [facility], [place] [address] [place], [place] [address]   Type and screen [place]     Status: None   Collection Time: 07/08/19  8:11 PM  Result Value Ref Range   ABO/RH(D) B POS    Antibody Screen NEG    Sample Expiration      07/11/2019,2359 Performed at [facility], [place] [address], [place], [place] [address]   SARS CORONAVIRUS 2 (TAT 6-24 HRS) Nasopharyngeal Nasopharyngeal Swab     Status: None   Collection Time: 07/08/19  8:11 PM   Specimen: Nasopharyngeal Swab  Result Value Ref Range   SARS Coronavirus 2 NEGATIVE NEGATIVE    Comment: (NOTE) SARS-CoV-2 target nucleic acids are NOT DETECTED. The SARS-CoV-2 RNA is generally detectable in upper and lower respiratory specimens during the acute phase of infection. Negative results do not preclude SARS-CoV-2 infection, do not rule out co-infections with other pathogens, and should not be used as the sole basis for treatment or other patient management decisions. Negative results must be combined with clinical observations, patient history, and epidemiological information. The expected result is Negative. Fact Sheet for Patients: SugarRoll.be Fact Sheet for Healthcare Providers: https://www.woods-mathews.com/ This test is not yet approved or cleared by the [place] FDA and  has been authorized for detection and/or diagnosis of SARS-CoV-2 by FDA under an Emergency Use Authorization (EUA). This EUA will remain  in effect (meaning this test can be used) for the duration of the COVID-19 declaration under Section 56 4(b)(1) of the Act, 21 U.S.C. section 360bbb-3(b)(1), unless the authorization is terminated or revoked sooner. Performed at [facility], [place] [address], [place], [place] [address]   MRSA PCR Screening     Status: None    Collection Time: 07/09/19 12:05 AM   Specimen: Nasal Mucosa; Nasopharyngeal  Result Value Ref Range   MRSA by PCR NEGATIVE NEGATIVE    Comment:        The GeneXpert MRSA Assay (FDA approved for NASAL specimens only), is one component of a comprehensive MRSA colonization surveillance program. It is not intended to diagnose MRSA infection nor to guide or monitor treatment for MRSA infections. Performed at [facility], [address] [place], [place] [address]   CBC     Status: Abnormal   Collection Time: 07/09/19  4:42 AM  Result Value Ref Range   WBC 6.8 4.0 - 10.5 K/uL   RBC 4.13 3.87 - 5.11 MIL/uL   Hemoglobin 10.8 (L) 12.0 - 15.0 g/dL   HCT 89.2 (L) 11.9 - 41.7 %   MCV 85.2 80.0 - 100.0 fL   MCH 26.2 26.0 - 34.0 pg   MCHC 30.7 30.0 - 36.0 g/dL   RDW 40.8 (H) 14.4 - 81.8 %   Platelets 287 150 - 400 K/uL   nRBC 0.0 0.0 - 0.2 %    Comment: Performed at [facility], [address] [place], [place] [address]  INR Routine     Status: None   Collection Time: 07/09/19  4:42 AM  Result Value Ref Range   Prothrombin Time 13.3 11.4 - 15.2 seconds   INR 1.0 0.8 - 1.2    Comment: (NOTE) INR goal varies based on device and disease states. Performed at [facility], [address], [place], [place] [address]   Comprehensive metabolic panel     Status: Abnormal   Collection Time: 07/09/19  4:42 AM  Result Value Ref Range   Sodium 140 135 - 145 mmol/L   Potassium 3.8 3.5 - 5.1 mmol/L   Chloride 104 98 - 111 mmol/L   CO2 27 22 - 32 mmol/L   Glucose, Bld 101 (H) 70 - 99 mg/dL   BUN 20 8 - 23 mg/dL   Creatinine, Ser 3.78 0.44 - 1.00 mg/dL   Calcium 8.9 8.9 - 58.8 mg/dL   Total Protein 6.6 6.5 - 8.1 g/dL   Albumin 3.9 3.5 - 5.0 g/dL   AST 24 15 - 41 U/L   ALT 19 0 - 44 U/L   Alkaline Phosphatase 52 38 - 126 U/L   Total Bilirubin 1.1 0.3 - 1.2 mg/dL   GFR calc non Af Amer >60 >60 mL/min   GFR calc Af Amer >60 >60  mL/min   Anion gap 9 5 - 15    Comment: Performed at [facility], [address], [place], [place] [address]    DG Chest 1 View  Result Date: 07/08/2019 CLINICAL DATA:  Pt presents from home after she had a fall. Pt was distracted and fell onto her right side. Pt believes that she has a hip fracture. Pt a/o x 4. Pre op for right hip fracture. No chest complaints. EXAM: CHEST  1 VIEW COMPARISON:  None. FINDINGS: The heart size and mediastinal contours are within normal limits. The lungs are clear. No pneumothorax or large pleural effusion. The visualized skeletal structures are unremarkable. IMPRESSION: No active cardiopulmonary disease. Electronically Signed   By: Emmaline Kluver M.D.   On: 07/08/2019 19:24   DG Knee Right Port  Result Date: 07/08/2019 CLINICAL DATA:  Initial preoperative evaluation, known hip fracture. EXAM: PORTABLE RIGHT KNEE - 1-2 VIEW COMPARISON:  Prior radiograph of the right hip performed earlier the same day. FINDINGS: No acute fracture or dislocation. Small joint effusion noted. Mild-to-moderate tricompartmental degenerative osteoarthrosis. Osseous mineralization normal. No soft tissue abnormality. IMPRESSION: 1. No acute fracture or dislocation. 2. Small joint effusion. 3. Mild-to-moderate tricompartmental degenerative osteoarthrosis. Electronically Signed   By: Rise Mu M.D.   On: 07/08/2019 22:14   DG HIP UNILAT WITH PELVIS 1V RIGHT  Result Date: 07/08/2019 CLINICAL DATA:  Known right femoral neck fracture EXAM: DG  HIP (WITH OR WITHOUT PELVIS) 1V RIGHT COMPARISON:  Films from earlier in the same day. FINDINGS: Oblique image of the proximal right femur was obtained and reveals a fracture through the subcapital region with mild angulation at the fracture site. No other fracture is seen. IMPRESSION: Proximal femoral neck fracture Electronically Signed   By: Alcide Clever M.D.   On: 07/08/2019 22:50   DG Hip Unilat  With Pelvis 2-3 Views  Right  Result Date: 07/08/2019 CLINICAL DATA:  Initial evaluation for acute trauma, fall. Right hip pain. EXAM: DG HIP (WITH OR WITHOUT PELVIS) 2-3V RIGHT COMPARISON:  None. FINDINGS: Subtle cortical disruption with linear lucency seen extending through the medial aspect of the right femoral neck, consistent with acute nondisplaced right femoral neck fracture. Femoral head remains normally position within the acetabulum. Bony pelvis intact. Limited views of the left hip unremarkable. Degenerative spondylosis noted within the lower lumbar spine. No acute soft tissue abnormality. IMPRESSION: Acute nondisplaced fracture involving the right femoral neck. Electronically Signed   By: Rise Mu M.D.   On: 07/08/2019 19:26    Review of Systems  HENT: Negative for ear discharge, ear pain, hearing loss and tinnitus.   Eyes: Negative for photophobia and pain.  Respiratory: Negative for cough and shortness of breath.   Cardiovascular: Negative for chest pain.  Gastrointestinal: Negative for abdominal pain, nausea and vomiting.  Genitourinary: Negative for dysuria, flank pain, frequency and urgency.  Musculoskeletal: Positive for arthralgias (Right hip). Negative for back pain, myalgias and neck pain.  Neurological: Negative for dizziness and headaches.  Hematological: Does not bruise/bleed easily.  Psychiatric/Behavioral: The patient is not nervous/anxious.    Blood pressure 117/69, pulse 77, temperature 98.5 F (36.9 C), temperature source Oral, resp. rate 18, height  (1.702 m), weight 79.4 kg, SpO2 95 %. Physical Exam  Constitutional: She appears well-developed and well-nourished. No distress.  HENT:  Head: Normocephalic and atraumatic.  Eyes: Conjunctivae are normal. Right eye exhibits no discharge. Left eye exhibits no discharge. No scleral icterus.  Cardiovascular: Normal rate and regular rhythm.  Respiratory: Effort normal. No respiratory distress.  Musculoskeletal:     Cervical  back: Normal range of motion.     Comments: RLE No traumatic wounds, ecchymosis, or rash  Mod TTP hip  No knee or ankle effusion  Knee stable to varus/ valgus and anterior/posterior stress  Sens DPN, SPN, TN intact  Motor EHL, ext, flex, evers 5/5  DP 1+, PT 1+, No significant edema  Neurological: She is alert.  Skin: Skin is warm and dry. She is not diaphoretic.  Psychiatric: She has a normal mood and affect. Her behavior is normal.    Assessment/Plan: Right hip fx -- Plan cannulated hip pinning by Dr. Dion Saucier this afternoon. Please keep NPO. PMDD     Freeman Caldron, PA-C Orthopedic Surgery [phone] 07/09/2019, 11:13 AM

## 2019-07-09 NOTE — Anesthesia Preprocedure Evaluation (Signed)
Anesthesia Evaluation  Patient identified by MRN, date of birth, ID band Patient awake    Reviewed: Allergy & Precautions, H&P , NPO status , Patient's Chart, lab work & pertinent test results  Airway Mallampati: II   Neck ROM: full    Dental   Pulmonary former smoker,    breath sounds clear to auscultation       Cardiovascular negative cardio ROS   Rhythm:regular Rate:Normal     Neuro/Psych  Headaches, PSYCHIATRIC DISORDERS Depression  Neuromuscular disease    GI/Hepatic GERD  ,  Endo/Other    Renal/GU      Musculoskeletal   Abdominal   Peds  Hematology   Anesthesia Other Findings   Reproductive/Obstetrics                             Anesthesia Physical Anesthesia Plan  ASA: II  Anesthesia Plan: General   Post-op Pain Management:    Induction: Intravenous  PONV Risk Score and Plan: 3 and Ondansetron, Dexamethasone, Treatment may vary due to age or medical condition and Midazolam  Airway Management Planned: Oral ETT  Additional Equipment:   Intra-op Plan:   Post-operative Plan: Extubation in OR  Informed Consent: I have reviewed the patients History and Physical, chart, labs and discussed the procedure including the risks, benefits and alternatives for the proposed anesthesia with the patient or authorized representative who has indicated his/her understanding and acceptance.       Plan Discussed with: CRNA, Anesthesiologist and Surgeon  Anesthesia Plan Comments:         Anesthesia Quick Evaluation

## 2019-07-09 NOTE — Progress Notes (Signed)
Initial Nutrition Assessment  INTERVENTION:   -Ensure Enlive po BID, each supplement provides 350 kcal and 20 grams of protein -Multivitamin with minerals daily  NUTRITION DIAGNOSIS:   Increased nutrient needs related to post-op healing, hip fracture as evidenced by estimated needs.  GOAL:   Patient will meet greater than or equal to 90% of their needs  MONITOR:   PO intake, Supplement acceptance, Labs, Weight trends, I & O's  REASON FOR ASSESSMENT:   Consult Hip fracture protocol  ASSESSMENT:   63 y.o. female with medical history significant of few medical problems.  Presents to ED with fall. Was walking dog, became distracted, had mechanical fall.  Got up and walked home but having progressively worsening R hip pain so came in to ED.  Patient in OR for cannulated hip pinning by orthopedics. Pt NPO for procedure. RD will order Ensure supplements and daily MVI to aid in post-op healing.   Per weight records, pt with no weight loss.   Labs reviewed. Medications: MAG-OX tablet   NUTRITION - FOCUSED PHYSICAL EXAM:  In OR  Diet Order:   Diet Order            Diet NPO time specified  Diet effective midnight              EDUCATION NEEDS:   No education needs have been identified at this time  Skin:  Skin Assessment: Reviewed RN Assessment  Last BM:  12/13  Height:   Ht Readings from Last 1 Encounters:  07/08/19 5\' 7"  (1.702 m)    Weight:   Wt Readings from Last 1 Encounters:  07/08/19 79.4 kg    Ideal Body Weight:  61.3 kg  BMI:  Body mass index is 27.41 kg/m.  Estimated Nutritional Needs:   Kcal:  1700-1900  Protein:  70-80g  Fluid:  1.8L/day  Clayton Bibles, MS, RD, LDN Inpatient Clinical Dietitian Pager: [phone] After Hours Pager: [phone]

## 2019-07-09 NOTE — Anesthesia Procedure Notes (Signed)
Procedure Name: Intubation Date/Time: 07/09/2019 4:13 PM Performed by: Cynda Familia, CRNA Pre-anesthesia Checklist: Patient identified, Emergency Drugs available, Suction available and Patient being monitored Patient Re-evaluated:Patient Re-evaluated prior to induction Oxygen Delivery Method: Circle System Utilized Preoxygenation: Pre-oxygenation with 100% oxygen Induction Type: IV induction Ventilation: Mask ventilation without difficulty Laryngoscope Size: Glidescope Tube type: Oral Number of attempts: 1 Airway Equipment and Method: Stylet Placement Confirmation: ETT inserted through vocal cords under direct vision,  positive ETCO2 and breath sounds checked- equal and bilateral Secured at: 22 cm Tube secured with: Tape Dental Injury: Teeth and Oropharynx as per pre-operative assessment  Comments: Smooth RSI [place]-- intubation AM CRNA atraumatic-- teeth and mouth as preop-- chipping left front present prior to laryngoscopy-- unchanged with ETT insertion-- bilat BS

## 2019-07-09 NOTE — Anesthesia Procedure Notes (Signed)
Date/Time: 07/09/2019 5:42 PM Performed by: Cynda Familia, CRNA Oxygen Delivery Method: Simple face mask Placement Confirmation: breath sounds checked- equal and bilateral and positive ETCO2 Dental Injury: Teeth and Oropharynx as per pre-operative assessment

## 2019-07-09 NOTE — Progress Notes (Signed)
PROGRESS NOTE    Erin Arnold  BMW:[id number] DOB: 01/22/1956 DOA: 07/08/2019 PCP: Blair Heys, MD    Brief Narrative:  63 year old female, relatively healthy admitted with right hip fracture following a mechanical fall   Assessment & Plan:   Principal Problem:   Fracture of femoral neck, right, closed (HCC)  Right hip fracture Secondary to mechanical fall Orthopedics on board Plan for surgical intervention today Pain control per Ortho  DVT prophylaxis: Lovenox SQ Code Status: Full code  Family Communication: Discussed plan with patient Disposition Plan: To be determined, pending therapy recommendations   Consultants:   Orthopedic  Procedures:  Right hip surgical intervention for fracture  Antimicrobials:   None   Subjective: No acute events.  Patient reports doing fine except for feeling hungry while having to be n.p.o. for the procedure.  No other complaints.  Plan for OR today.  Objective: Vitals:   07/09/19 1830 07/09/19 1845 07/09/19 1858 07/09/19 2000  BP: 117/77 119/86 123/73 114/62  Pulse: 88 82 88 84  Resp: 12 20 18 16   Temp:  97.8 F (36.6 C) 97.7 F (36.5 C) 99 F (37.2 C)  TempSrc:   Oral Oral  SpO2: 95% 95% 100% 100%  Weight:      Height:        Intake/Output Summary (Last 24 hours) at 07/09/2019 2019 Last data filed at 07/09/2019 1900 Gross per 24 hour  Intake 2050 ml  Output 2200 ml  Net -150 ml   Filed Weights   07/08/19 1844  Weight: 79.4 kg    Examination:  General exam: Appears calm and comfortable  Respiratory system: Clear to auscultation. Respiratory effort normal. Cardiovascular system: S1 & S2 heard, RRR. No JVD, murmurs, rubs, gallops or clicks. No pedal edema. Gastrointestinal system: Abdomen is nondistended, soft and nontender. No organomegaly or masses felt. Normal bowel sounds heard. Central nervous system: Alert and oriented. No focal neurological deficits. Extremities: Right hip tender to  palpate Skin: No rashes, lesions or ulcers Psychiatry: Judgement and insight appear normal. Mood & affect appropriate.    Data Reviewed: I have personally reviewed following labs and imaging studies  CBC: Recent Labs  Lab 07/08/19 1909 07/09/19 0442  WBC 10.0 6.8  NEUTROABS 7.9*  --   HGB 10.9* 10.8*  HCT 35.2* 35.2*  MCV 85.6 85.2  PLT 322 287   Basic Metabolic Panel: Recent Labs  Lab 07/08/19 1909 07/09/19 0442  NA 138 140  K 3.7 3.8  CL 103 104  CO2 24 27  GLUCOSE 119* 101*  BUN 29* 20  CREATININE 1.00 0.83  CALCIUM 9.2 8.9   GFR: Estimated Creatinine Clearance: 76.2 mL/min (by C-G formula based on SCr of 0.83 mg/dL). Liver Function Tests: Recent Labs  Lab 07/09/19 0442  AST 24  ALT 19  ALKPHOS 52  BILITOT 1.1  PROT 6.6  ALBUMIN 3.9   No results for input(s): LIPASE, AMYLASE in the last 168 hours. No results for input(s): AMMONIA in the last 168 hours. Coagulation Profile: Recent Labs  Lab 07/08/19 1909 07/09/19 0442  INR 0.9 1.0   Cardiac Enzymes: No results for input(s): CKTOTAL, CKMB, CKMBINDEX, TROPONINI in the last 168 hours. BNP (last 3 results) No results for input(s): PROBNP in the last 8760 hours. HbA1C: No results for input(s): HGBA1C in the last 72 hours. CBG: No results for input(s): GLUCAP in the last 168 hours. Lipid Profile: No results for input(s): CHOL, HDL, LDLCALC, TRIG, CHOLHDL, LDLDIRECT in the last 72 hours. Thyroid  Function Tests: No results for input(s): TSH, T4TOTAL, FREET4, T3FREE, THYROIDAB in the last 72 hours. Anemia Panel: No results for input(s): VITAMINB12, FOLATE, FERRITIN, TIBC, IRON, RETICCTPCT in the last 72 hours. Sepsis Labs: No results for input(s): PROCALCITON, LATICACIDVEN in the last 168 hours.  Recent Results (from the past 240 hour(s))  SARS CORONAVIRUS 2 (TAT 6-24 HRS) Nasopharyngeal Nasopharyngeal Swab     Status: None   Collection Time: 07/08/19  8:11 PM   Specimen: Nasopharyngeal Swab   Result Value Ref Range Status   SARS Coronavirus 2 NEGATIVE NEGATIVE Final    Comment: (NOTE) SARS-CoV-2 target nucleic acids are NOT DETECTED. The SARS-CoV-2 RNA is generally detectable in upper and lower respiratory specimens during the acute phase of infection. Negative results do not preclude SARS-CoV-2 infection, do not rule out co-infections with other pathogens, and should not be used as the sole basis for treatment or other patient management decisions. Negative results must be combined with clinical observations, patient history, and epidemiological information. The expected result is Negative. Fact Sheet for Patients: SugarRoll.be Fact Sheet for Healthcare Providers: https://www.woods-mathews.com/ This test is not yet approved or cleared by the [place] FDA and  has been authorized for detection and/or diagnosis of SARS-CoV-2 by FDA under an Emergency Use Authorization (EUA). This EUA will remain  in effect (meaning this test can be used) for the duration of the COVID-19 declaration under Section 56 4(b)(1) of the Act, 21 U.S.C. section 360bbb-3(b)(1), unless the authorization is terminated or revoked sooner. Performed at [facility], [place] [address], [place], [place] [address]   MRSA PCR Screening     Status: None   Collection Time: 07/09/19 12:05 AM   Specimen: Nasal Mucosa; Nasopharyngeal  Result Value Ref Range Status   MRSA by PCR NEGATIVE NEGATIVE Final    Comment:        The GeneXpert MRSA Assay (FDA approved for NASAL specimens only), is one component of a comprehensive MRSA colonization surveillance program. It is not intended to diagnose MRSA infection nor to guide or monitor treatment for MRSA infections. Performed at [facility], [place] [address], [place], [place] [address]          Radiology Studies: DG Chest 1 View  Result Date: 07/08/2019 CLINICAL DATA:  Pt presents  from home after she had a fall. Pt was distracted and fell onto her right side. Pt believes that she has a hip fracture. Pt a/o x 4. Pre op for right hip fracture. No chest complaints. EXAM: CHEST  1 VIEW COMPARISON:  None. FINDINGS: The heart size and mediastinal contours are within normal limits. The lungs are clear. No pneumothorax or large pleural effusion. The visualized skeletal structures are unremarkable. IMPRESSION: No active cardiopulmonary disease. Electronically Signed   By: Audie Pinto M.D.   On: 07/08/2019 19:24   Pelvis Portable  Result Date: 07/09/2019 CLINICAL DATA:  Post right hip pinning EXAM: PORTABLE PELVIS 1-2 VIEWS COMPARISON:  07/08/2019 FINDINGS: Interval the right femoral neck pinning in the proximal right femur across fracture. No hardware or bony complicating feature. IMPRESSION: Right proximal femoral pinning.  No complicating feature. Electronically Signed   By: Rolm Baptise M.D.   On: 07/09/2019 18:10   DG Knee Right Port  Result Date: 07/08/2019 CLINICAL DATA:  Initial preoperative evaluation, known hip fracture. EXAM: PORTABLE RIGHT KNEE - 1-2 VIEW COMPARISON:  Prior radiograph of the right hip performed earlier the same day. FINDINGS: No acute fracture or dislocation. Small joint  effusion noted. Mild-to-moderate tricompartmental degenerative osteoarthrosis. Osseous mineralization normal. No soft tissue abnormality. IMPRESSION: 1. No acute fracture or dislocation. 2. Small joint effusion. 3. Mild-to-moderate tricompartmental degenerative osteoarthrosis. Electronically Signed   By: Rise MuBenjamin  McClintock M.D.   On: 07/08/2019 22:14   DG C-Arm 1-60 Min-No Report  Result Date: 07/09/2019 Fluoroscopy was utilized by the requesting physician.  No radiographic interpretation.   DG HIP UNILAT WITH PELVIS 1V RIGHT  Result Date: 07/08/2019 CLINICAL DATA:  Known right femoral neck fracture EXAM: DG HIP (WITH OR WITHOUT PELVIS) 1V RIGHT COMPARISON:  Films from earlier in  the same day. FINDINGS: Oblique image of the proximal right femur was obtained and reveals a fracture through the subcapital region with mild angulation at the fracture site. No other fracture is seen. IMPRESSION: Proximal femoral neck fracture Electronically Signed   By: Alcide CleverMark  Lukens M.D.   On: 07/08/2019 22:50   DG HIP OPERATIVE UNILAT W OR W/O PELVIS RIGHT  Result Date: 07/09/2019 CLINICAL DATA:  Right hip pinning for a femoral neck fracture. EXAM: OPERATIVE RIGHT HIP (WITH PELVIS IF PERFORMED) 3 VIEWS TECHNIQUE: Fluoroscopic spot image(s) were submitted for interpretation post-operatively. COMPARISON:  07/08/2019 FINDINGS: Three C-arm views of the right hip demonstrate placement of 3 screws bridging the previously demonstrated right femoral neck fracture with anatomic position and alignment. IMPRESSION: Hardware fixation of the previously demonstrated right femoral neck fracture. Electronically Signed   By: Beckie SaltsSteven  Reid M.D.   On: 07/09/2019 17:45   DG Hip Unilat  With Pelvis 2-3 Views Right  Result Date: 07/08/2019 CLINICAL DATA:  Initial evaluation for acute trauma, fall. Right hip pain. EXAM: DG HIP (WITH OR WITHOUT PELVIS) 2-3V RIGHT COMPARISON:  None. FINDINGS: Subtle cortical disruption with linear lucency seen extending through the medial aspect of the right femoral neck, consistent with acute nondisplaced right femoral neck fracture. Femoral head remains normally position within the acetabulum. Bony pelvis intact. Limited views of the left hip unremarkable. Degenerative spondylosis noted within the lower lumbar spine. No acute soft tissue abnormality. IMPRESSION: Acute nondisplaced fracture involving the right femoral neck. Electronically Signed   By: Rise MuBenjamin  McClintock M.D.   On: 07/08/2019 19:26        Scheduled Meds: . acetaminophen  500 mg Oral Q6H  . docusate sodium  100 mg Oral BID  . [START ON 07/10/2019] enoxaparin (LOVENOX) injection  40 mg Subcutaneous Q24H  . [START ON  07/10/2019] feeding supplement (ENSURE ENLIVE)  237 mL Oral BID BM  . fentaNYL      . ferrous sulfate  325 mg Oral TID PC  . ketorolac  7.5 mg Intravenous Q6H  . magnesium oxide  200 mg Oral Daily  . [START ON 07/10/2019] multivitamin with minerals  1 tablet Oral Daily  . senna  1 tablet Oral BID  . sertraline  25 mg Oral Daily   Continuous Infusions: . 0.45 % NaCl with KCl 20 mEq / L 75 mL/hr at 07/09/19 2001  .  ceFAZolin (ANCEF) IV 2 g (07/09/19 2003)  . methocarbamol (ROBAXIN) IV 500 mg (07/09/19 1202)     LOS: 1 day    Time spent: Spent ~30 minutes in coordinating care for this patient including bedside patient care.   Liborio NixonJanki Y Breella Vanostrand, MD [facility] If 7PM-7AM, please contact night-coverage 07/09/2019, 8:19 PM

## 2019-07-09 NOTE — Transfer of Care (Signed)
Immediate Anesthesia Transfer of Care Note  Patient: Erin Arnold  Procedure(s) Performed: CANNULATED HIP PINNING (Right Hip)  Patient Location: PACU  Anesthesia Type:General  Level of Consciousness: awake and alert   Airway & Oxygen Therapy: Patient Spontanous Breathing and Patient connected to face mask oxygen  Post-op Assessment: Report given to RN and Post -op Vital signs reviewed and stable  Post vital signs: Reviewed and stable  Last Vitals:  Vitals Value Taken Time  BP 125/68 07/09/19 1753  Temp    Pulse 97 07/09/19 1755  Resp 11 07/09/19 1755  SpO2 100 % 07/09/19 1755  Vitals shown include unvalidated device data.  Last Pain:  Vitals:   07/09/19 1520  TempSrc: Oral  PainSc: 3       Patients Stated Pain Goal: 3 (40/34/74 2595)  Complications: No apparent anesthesia complications

## 2019-07-10 ENCOUNTER — Encounter: Payer: Self-pay | Admitting: *Deleted

## 2019-07-10 LAB — BASIC METABOLIC PANEL
Anion gap: 9 (ref 5–15)
BUN: 12 mg/dL (ref 8–23)
CO2: 27 mmol/L (ref 22–32)
Calcium: 8.6 mg/dL — ABNORMAL LOW (ref 8.9–10.3)
Chloride: 103 mmol/L (ref 98–111)
Creatinine, Ser: 0.86 mg/dL (ref 0.44–1.00)
GFR calc Af Amer: >60 mL/min (ref >60)
GFR calc non Af Amer: >60 mL/min (ref >60)
Glucose, Bld: 128 mg/dL — ABNORMAL HIGH (ref 70–99)
Potassium: 4.1 mmol/L (ref 3.5–5.1)
Sodium: 139 mmol/L (ref 135–145)

## 2019-07-10 LAB — CBC
HCT: 34.5 % — ABNORMAL LOW (ref 36.0–46.0)
Hemoglobin: 10.3 g/dL — ABNORMAL LOW (ref 12.0–15.0)
MCH: 26.1 pg (ref 26.0–34.0)
MCHC: 29.9 g/dL — ABNORMAL LOW (ref 30.0–36.0)
MCV: 87.6 fL (ref 80.0–100.0)
Platelets: 266 10*3/uL (ref 150–400)
RBC: 3.94 MIL/uL (ref 3.87–5.11)
RDW: 17 % — ABNORMAL HIGH (ref 11.5–15.5)
WBC: 7.6 10*3/uL (ref 4.0–10.5)
nRBC: 0 % (ref 0.0–0.2)

## 2019-07-10 MED ORDER — BACLOFEN 10 MG PO TABS: 10.0000 mg | ORAL_TABLET | Freq: Three times a day (TID) | ORAL | 0 refills | Status: DC

## 2019-07-10 MED ORDER — HYDROCODONE-ACETAMINOPHEN 5-325 MG PO TABS: 1.0000 | ORAL_TABLET | Freq: Four times a day (QID) | ORAL | 0 refills | Status: DC | PRN

## 2019-07-10 MED ORDER — ENOXAPARIN SODIUM 30 MG/0.3ML ~~LOC~~ SOLN: 30.0000 mg | Freq: Two times a day (BID) | SUBCUTANEOUS | 0 refills | Status: DC

## 2019-07-10 MED ORDER — SENNA-DOCUSATE SODIUM 8.6-50 MG PO TABS: 2.0000 | ORAL_TABLET | Freq: Every day | ORAL | 1 refills | Status: DC

## 2019-07-10 MED ORDER — CALCIUM CARBONATE-VITAMIN D 500-200 MG-UNIT PO TABS
2.0000 | ORAL_TABLET | Freq: Every day | ORAL | Status: DC
  Administered 2019-07-10: 2 via ORAL
  Filled 2019-07-10: qty 2

## 2019-07-10 NOTE — Progress Notes (Signed)
     Subjective:  Erin Arnold is a 63 yo female who presented to the ED who fell on 12/13 while walking her dog. X-rays in the ED showed a right femoral neck fracture and orthopedic surgery was consulted. Right cannulated hip pinning was performed 99/24 without complication.  She lives alone in a one story home but has good support from friends and neighbors whole are able to help with household chores and driving.   Today patient sitting up in bed. She reports pain is mild. Denies nausea, vomiting, fevers, or chills. She has not yet had her first PT visit to evaluate ambulation.   Objective:   VITALS:   Vitals:   07/09/19 2211 07/09/19 2309 07/10/19 0242 07/10/19 0621  BP: 108/67 108/70 103/62 104/66  Pulse: 78 81 65 66  Resp: 18 18 18 16   Temp: 98.2 F (36.8 C) 97.8 F (36.6 C) (!) 97.5 F (36.4 C) 98.2 F (36.8 C)  TempSrc: Oral Oral Oral Oral  SpO2: 99% 99% 100% 100%  Weight:      Height:        Neurologically intact ABD soft Neurovascular intact Sensation intact distally Intact pulses distally Dorsiflexion/Plantar flexion intact Incision: scant drainage  Lab Results  Component Value Date   WBC 7.6 07/10/2019   HGB 10.3 (L) 07/10/2019   HCT 34.5 (L) 07/10/2019   MCV 87.6 07/10/2019   PLT 266 07/10/2019   BMET    Component Value Date/Time   NA 139 07/10/2019 0519   K 4.1 07/10/2019 0519   CL 103 07/10/2019 0519   CO2 27 07/10/2019 0519   GLUCOSE 128 (H) 07/10/2019 0519   BUN 12 07/10/2019 0519   CREATININE 0.86 07/10/2019 0519   CALCIUM 8.6 (L) 07/10/2019 0519   GFRNONAA >60 07/10/2019 0519   GFRAA >60 07/10/2019 0519     Assessment/Plan: 1 Day Post-Op   Principal Problem:   Fracture of femoral neck, right, closed (HCC)  Continue touch toe weightbearing, will discharge on Lovenox for 4 weeks. Plan to discharge this afternoon after first therapy visit.    Ventura Bruns 07/10/2019, 9:38 AM

## 2019-07-10 NOTE — Discharge Summary (Signed)
Discharge Summary  Patient ID: SIYA FLURRY MRN: [id number] DOB/AGE: Nov 30, 1955 63 y.o.  Admit date: 07/08/2019 Discharge date: 07/10/2019  Admission Diagnoses: right femoral neck fracture  Discharge Diagnoses:  Principal Problem:   Fracture of femoral neck, right, closed (HCC) s/p right cannulated hip pinning  Past Medical History:  Diagnosis Date  . Carpal tunnel syndrome of right wrist 05/2013  . Constipation    occasional  . GERD (gastroesophageal reflux disease)    OTC as needed  . Headache(784.0)    sinus  . History of colitis   . History of palpitations    under control with Metoprolol  . PMDD (premenstrual dysphoric disorder)    takes HCTZ to control sx.  [name] PONV (postoperative nausea and vomiting)   . Seasonal allergies    Past Surgical History:  Procedure Laterality Date  . CARPAL TUNNEL RELEASE Right 06/14/2013   Procedure: CARPAL TUNNEL RELEASE; RIGHT ;  Surgeon: Loreta Ave, MD;  Location: [facility];  Service: Orthopedics;  Laterality: Right;  . ELBOW ARTHROSCOPY Left 02/08/2013   Procedure: LEFT ELBOW ARTHROSCOPY WITH DEBRIDEMENT LIMITED/ TENNIS ELBOW  REPAIR, PARTIAL SYNOVECTOMY AND CHONDROPLASTY;  Surgeon: Loreta Ave, MD;  Location: [facility];  Service: Orthopedics;  Laterality: Left;  . KNEE ARTHROSCOPY W/ ACL RECONSTRUCTION Left 02/18/2011   x 2  . WRIST SURGERY Left 05/26/2012   radial nerve decompression    Discharged Condition: good  Hospital Course: Erin Arnold is a 63 yo female who was admitted 12/13 for a right femoral neck fracture. She was taken to the OR on 12/14 for a right cannulated hip pinning.  She was given preoperative antibiotics: Anti-infectives (From admission, onward)   Start     Dose/Rate Route Frequency Ordered Stop   07/09/19 2000  ceFAZolin (ANCEF) IVPB 2g/100 mL premix     2 g 200 mL/hr over 30 Minutes Intravenous Every 6 hours 07/09/19 1903 07/10/19 0233   07/09/19 1000  ceFAZolin  (ANCEF) IVPB 2g/100 mL premix  Status:  Discontinued     2 g 200 mL/hr over 30 Minutes Intravenous On call to O.R. 07/09/19 0945 07/09/19 1853     She was given sequential compression devices, early ambulation, and lovenox for DVT prophylaxis. She will continue touch toe weight bearing.    Disposition: Discharge disposition: 01-Home or Self Care       Discharge disposition: 01-Home or Self Care       Discharge Instructions    Touch down weight bearing   Complete by: As directed      Allergies as of 07/10/2019   No Known Allergies     Medication List    TAKE these medications   albuterol 108 (90 Base) MCG/ACT inhaler Commonly known as: VENTOLIN HFA Inhale 2 puffs into the lungs every 6 (six) hours as needed for wheezing or shortness of breath.   baclofen 10 MG tablet Commonly known as: LIORESAL Take 1 tablet (10 mg total) by mouth 3 (three) times daily. As needed for muscle spasm   CA-MG-VIT D-L METHYLFOL-B6-B12 PO Take 1 tablet by mouth daily.   Coenzyme Q10 10 MG capsule Take 1 capsule by mouth daily.   enoxaparin 30 MG/0.3ML injection Commonly known as: Lovenox Inject 0.3 mLs (30 mg total) into the skin every 12 (twelve) hours.   HYDROcodone-acetaminophen 5-325 MG tablet Commonly known as: Norco Take 1-2 tablets by mouth every 6 (six) hours as needed for moderate pain. MAXIMUM TOTAL ACETAMINOPHEN DOSE IS 4000 MG  PER DAY   lidocaine 5 % ointment Commonly known as: XYLOCAINE Apply 1 application topically 4 (four) times daily as needed for pain.   LYSINE PO Take 1 mg by mouth daily.   magnesium 30 MG tablet Take 30 mg by mouth daily.   OSTEO BI-FLEX ADV JOINT SHIELD PO Take 1 tablet by mouth daily.   PROBIOTIC FORMULA PO Take 1 tablet by mouth daily.   QC Tumeric Complex 500 MG Caps Generic drug: Turmeric Take 500 mg by mouth daily.   sennosides-docusate sodium 8.6-50 MG tablet Commonly known as: SENOKOT-S Take 2 tablets by mouth daily.    sertraline 50 MG tablet Commonly known as: ZOLOFT Take 25 mg by mouth daily.   traZODone 50 MG tablet Commonly known as: DESYREL Take 50 mg by mouth at bedtime as needed for sleep.   valACYclovir 1000 MG tablet Commonly known as: VALTREX Take 500 mg by mouth daily as needed (flare ups).   VITAMIN C PO Take 1 tablet by mouth daily.   Vitamin K2 100 MCG Caps Take 100 mcg by mouth daily.   ZINC PO Take 1 tablet by mouth daily.   zolpidem 10 MG tablet Commonly known as: AMBIEN Take 10 mg by mouth at bedtime as needed for sleep.            Discharge Care Instructions  (From admission, onward)         Start     Ordered   07/10/19 0000  Touch down weight bearing     07/10/19 1339         Follow-up Information    Marchia Bond, MD. Schedule an appointment as soon as possible for a visit in 2 weeks.   Specialty: Orthopedic Surgery Contact information: [place] [address] [phone]           Signed: Ventura Bruns 07/10/2019, 1:39 PM

## 2019-07-10 NOTE — Evaluation (Addendum)
Physical Therapy Evaluation Patient Details Name: Erin Arnold MRN: [id number] DOB: [birth year] Today's Date: 07/10/2019   History of Present Illness  63 y.o. female with medical history significant of few medical problems.  Presents to ED with fall.Dx of R femoral neck fracture, s/p pinning 07/09/19.  Clinical Impression  Pt admitted with above diagnosis. Pt ambulated 120' with RW, no loss of balance. Instructed pt in HEP. She declined stair training as she's familiar with technique from prior knee surgeries. Pt lives alone but has assistance available from neighbors. From PT standpoint, she is safe to DC home. Pt currently with functional limitations due to the deficits listed below (see PT Problem List). Pt will benefit from skilled PT to increase their independence and safety with mobility to allow discharge to the venue listed below.       Follow Up Recommendations Follow surgeon's recommendation for DC plan and follow-up therapies    Equipment Recommendations  3in1 (PT);Rolling walker with 5" wheels    Recommendations for Other Services       Precautions / Restrictions Precautions Precautions: Fall Restrictions Weight Bearing Restrictions: Yes RLE Weight Bearing: Partial weight bearing      Mobility  Bed Mobility Overal bed mobility: Modified Independent             General bed mobility comments: HOB up  Transfers Overall transfer level: Needs assistance Equipment used: Rolling walker (2 wheeled) Transfers: Sit to/from Stand Sit to Stand: Supervision         General transfer comment: VCs hand placement  Ambulation/Gait Ambulation/Gait assistance: Supervision Gait Distance (Feet): 120 Feet Assistive device: Rolling walker (2 wheeled) Gait Pattern/deviations: Step-through pattern;Decreased stride length Gait velocity: decr   General Gait Details: good adherence to PWB status, no loss of balance  Stairs            Wheelchair Mobility     Modified Rankin (Stroke Patients Only)       Balance Overall balance assessment: Modified Independent                                           Pertinent Vitals/Pain Pain Assessment: 0-10 Pain Score: 1  Pain Location: R hip Pain Descriptors / Indicators: Sore Pain Intervention(s): Limited activity within patient's tolerance;Monitored during session;Premedicated before session;Ice applied    Home Living Family/patient expects to be discharged to:: Private residence Living Arrangements: Alone Available Help at Discharge: Neighbor;Available PRN/intermittently   Home Access: Stairs to enter Entrance Stairs-Rails: Right;Left;Can reach both Entrance Stairs-Number of Steps: 2 Home Layout: One level Home Equipment: None      Prior Function Level of Independence: Independent         Comments: wear B knee supports     Hand Dominance        Extremity/Trunk Assessment   Upper Extremity Assessment Upper Extremity Assessment: Overall WFL for tasks assessed    Lower Extremity Assessment Lower Extremity Assessment: RLE deficits/detail RLE Deficits / Details: hip +3/5 R hip RLE Sensation: WNL RLE Coordination: WNL    Cervical / Trunk Assessment Cervical / Trunk Assessment: Normal  Communication   Communication: No difficulties  Cognition Arousal/Alertness: Awake/alert Behavior During Therapy: WFL for tasks assessed/performed Overall Cognitive Status: Within Functional Limits for tasks assessed  General Comments      Exercises General Exercises - Lower Extremity Ankle Circles/Pumps: AROM;Both;10 reps Quad Sets: AROM;Right;5 reps;Supine Short Arc Quad: AROM;Right;5 reps;Supine Long Arc Quad: AROM;Right;10 reps;Supine Heel Slides: AAROM;Right;10 reps;Supine Hip ABduction/ADduction: AAROM;Right;10 reps;Supine   Assessment/Plan    PT Assessment Patient needs continued PT services  PT  Problem List Decreased strength;Decreased activity tolerance;Decreased mobility;Pain       PT Treatment Interventions Gait training;DME instruction;Stair training;Therapeutic exercise;Therapeutic activities;Patient/family education    PT Goals (Current goals can be found in the Care Plan section)  Acute Rehab PT Goals 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, 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 PT Goal Formulation: With patient Time For Goal Achievement: 07/17/19 Potential to Achieve Goals: Good    Frequency Min 5X/week   Barriers to discharge        Co-evaluation               AM-PAC PT "6 Clicks" Mobility  Outcome Measure Help needed turning from your back to your side while in a flat bed without using bedrails?: None Help needed moving from lying on your back to sitting on the side of a flat bed without using bedrails?: None Help needed moving to and from a bed to a chair (including a wheelchair)?: None Help needed standing up from a chair using your arms (e.g., wheelchair or bedside chair)?: None Help needed to walk in hospital room?: None Help needed climbing 3-5 steps with a railing? : A Little 6 Click Score: 23    End of Session Equipment Utilized During Treatment: Gait belt Activity Tolerance: Patient tolerated treatment well Patient left: in chair;with call bell/phone within reach;with chair alarm set Nurse Communication: Mobility status PT Visit Diagnosis: Difficulty in walking, not elsewhere classified (R26.2);Pain Pain - Right/Left: Right Pain - part of body: Hip    Time: 6712-4580 PT Time Calculation (min) (ACUTE ONLY): 31 min   Charges:   PT Evaluation $PT Eval Low Complexity: 1 Low PT Treatments $Gait Training: 8-22 mins       Ralene Bathe Kistler PT 07/10/2019  Acute Rehabilitation Services Pager [phone] Office [phone]

## 2019-07-10 NOTE — Plan of Care (Signed)
Pt was discharged home today. Instructions were reviewed with patient, and questions were answered. Pt was taken to main entrance via wheelchair by NT.  

## 2019-07-10 NOTE — TOC Transition Note (Signed)
Transition of Care [facility]) - CM/SW Discharge Note   Patient Details  Name: Erin Arnold MRN: [id number] Date of Birth: [birth year]  Transition of Care [facility]) CM/SW Contact:  Lia Hopping, [place] Phone Number: 07/10/2019, 3:09 PM   Clinical Narrative:    RW and 3 in 1 ordered through [facility] and will be delivered to the patient room.    Final next level of care: Home/Self Care Barriers to Discharge: No Barriers Identified   Patient Goals and CMS Choice     Choice offered to / list presented to : NA  Discharge Placement                       Discharge Plan and Services                DME Arranged: 3-N-1, Walker rolling DME Agency: [employer] Date DME Agency Contacted: 07/10/19 Time DME Agency Contacted: [phone] Representative spoke with at DME Agency: [place] ([place]) Interventions     Readmission Risk Interventions No flowsheet data found.

## 2019-07-10 NOTE — Discharge Summary (Signed)
Physician Discharge Summary  Erin MirzaMarina L Arnold ZOX:[id number] DOB: 02/03/1956 DOA: 07/08/2019  PCP: Blair HeysEhinger, Robert, MD  Admit date: 07/08/2019 Discharge date: 07/10/2019  Admitted From: Home  Disposition:  home  Recommendations for Outpatient Follow-up:  1. Per orthopedic for right hip fracture 2. Recommend DEXA scan for osteoporosis screening given Intra-Op note documentation of clinical osteoporosis   Discharge Condition: Stable CODE STATUS: Full code Diet recommendation: Regular  Brief/Interim Summary: 63 year old female with few medical problems admitted with closed right hip fracture secondary to mechanical fall s/p repair by orthopedic.  Patient was evaluated by PT and discharge by the orthopedic team with follow-up recommendations.  Discharge Diagnoses:  Principal Problem:   Fracture of femoral neck, right, closed [facility](HCC)   Discharge Instructions:  Discharge Instructions    DME Bedside commode   Complete by: As directed    Patient needs a bedside commode to treat with the following condition: Fracture of femoral neck, right, closed (HCC)   Touch down weight bearing   Complete by: As directed      Allergies as of 07/10/2019   No Known Allergies     Medication List    TAKE these medications   albuterol 108 (90 Base) MCG/ACT inhaler Commonly known as: VENTOLIN HFA Inhale 2 puffs into the lungs every 6 (six) hours as needed for wheezing or shortness of breath.   baclofen 10 MG tablet Commonly known as: LIORESAL Take 1 tablet (10 mg total) by mouth 3 (three) times daily. As needed for muscle spasm   CA-MG-VIT D-L METHYLFOL-B6-B12 PO Take 1 tablet by mouth daily.   Coenzyme Q10 10 MG capsule Take 1 capsule by mouth daily.   enoxaparin 30 MG/0.3ML injection Commonly known as: Lovenox Inject 0.3 mLs (30 mg total) into the skin every 12 (twelve) hours.   HYDROcodone-acetaminophen 5-325 MG tablet Commonly known as: Norco Take 1-2 tablets by mouth every 6 (six)  hours as needed for moderate pain. MAXIMUM TOTAL ACETAMINOPHEN DOSE IS 4000 MG PER DAY   lidocaine 5 % ointment Commonly known as: XYLOCAINE Apply 1 application topically 4 (four) times daily as needed for pain.   LYSINE PO Take 1 mg by mouth daily.   magnesium 30 MG tablet Take 30 mg by mouth daily.   OSTEO BI-FLEX ADV JOINT SHIELD PO Take 1 tablet by mouth daily.   PROBIOTIC FORMULA PO Take 1 tablet by mouth daily.   QC Tumeric Complex 500 MG Caps Generic drug: Turmeric Take 500 mg by mouth daily.   sennosides-docusate sodium 8.6-50 MG tablet Commonly known as: SENOKOT-S Take 2 tablets by mouth daily.   sertraline 50 MG tablet Commonly known as: ZOLOFT Take 25 mg by mouth daily.   traZODone 50 MG tablet Commonly known as: DESYREL Take 50 mg by mouth at bedtime as needed for sleep.   valACYclovir 1000 MG tablet Commonly known as: VALTREX Take 500 mg by mouth daily as needed (flare ups).   VITAMIN C PO Take 1 tablet by mouth daily.   Vitamin K2 100 MCG Caps Take 100 mcg by mouth daily.   ZINC PO Take 1 tablet by mouth daily.   zolpidem 10 MG tablet Commonly known as: AMBIEN Take 10 mg by mouth at bedtime as needed for sleep.            Durable Medical Equipment  (From admission, onward)         Start     Ordered   07/10/19 1445  For home use only DME Dan HumphreysWalker  rolling  Once    Question:  Patient needs a walker to treat with the following condition  Answer:  Aftercare following hip joint replacement surgery   07/10/19 1446   07/10/19 0000  DME Bedside commode    Question:  Patient needs a bedside commode to treat with the following condition  Answer:  Fracture of femoral neck, right, closed [facility])   07/10/19 1628           Discharge Care Instructions  (From admission, onward)         Start     Ordered   07/10/19 0000  Touch down weight bearing     07/10/19 1339         Follow-up Information    Marchia Bond, MD. Schedule an appointment  as soon as possible for a visit in 2 weeks.   Specialty: Orthopedic Surgery Contact information: [address] [place] [address] [phone]          No Known Allergies  Consultations:  Orthopedic   Procedures/Studies: DG Chest 1 View  Result Date: 07/08/2019 CLINICAL DATA:  Pt presents from home after she had a fall. Pt was distracted and fell onto her right side. Pt believes that she has a hip fracture. Pt a/o x 4. Pre op for right hip fracture. No chest complaints. EXAM: CHEST  1 VIEW COMPARISON:  None. FINDINGS: The heart size and mediastinal contours are within normal limits. The lungs are clear. No pneumothorax or large pleural effusion. The visualized skeletal structures are unremarkable. IMPRESSION: No active cardiopulmonary disease. Electronically Signed   By: Audie Pinto M.D.   On: 07/08/2019 19:24   Pelvis Portable  Result Date: 07/09/2019 CLINICAL DATA:  Post right hip pinning EXAM: PORTABLE PELVIS 1-2 VIEWS COMPARISON:  07/08/2019 FINDINGS: Interval the right femoral neck pinning in the proximal right femur across fracture. No hardware or bony complicating feature. IMPRESSION: Right proximal femoral pinning.  No complicating feature. Electronically Signed   By: Rolm Baptise M.D.   On: 07/09/2019 18:10   DG Knee Right Port  Result Date: 07/08/2019 CLINICAL DATA:  Initial preoperative evaluation, known hip fracture. EXAM: PORTABLE RIGHT KNEE - 1-2 VIEW COMPARISON:  Prior radiograph of the right hip performed earlier the same day. FINDINGS: No acute fracture or dislocation. Small joint effusion noted. Mild-to-moderate tricompartmental degenerative osteoarthrosis. Osseous mineralization normal. No soft tissue abnormality. IMPRESSION: 1. No acute fracture or dislocation. 2. Small joint effusion. 3. Mild-to-moderate tricompartmental degenerative osteoarthrosis. Electronically Signed   By: Jeannine Boga M.D.   On: 07/08/2019 22:14   DG C-Arm  1-60 Min-No Report  Result Date: 07/09/2019 Fluoroscopy was utilized by the requesting physician.  No radiographic interpretation.   DG HIP UNILAT WITH PELVIS 1V RIGHT  Result Date: 07/08/2019 CLINICAL DATA:  Known right femoral neck fracture EXAM: DG HIP (WITH OR WITHOUT PELVIS) 1V RIGHT COMPARISON:  Films from earlier in the same day. FINDINGS: Oblique image of the proximal right femur was obtained and reveals a fracture through the subcapital region with mild angulation at the fracture site. No other fracture is seen. IMPRESSION: Proximal femoral neck fracture Electronically Signed   By: Inez Catalina M.D.   On: 07/08/2019 22:50   DG HIP OPERATIVE UNILAT W OR W/O PELVIS RIGHT  Result Date: 07/09/2019 CLINICAL DATA:  Right hip pinning for a femoral neck fracture. EXAM: OPERATIVE RIGHT HIP (WITH PELVIS IF PERFORMED) 3 VIEWS TECHNIQUE: Fluoroscopic spot image(s) were submitted for interpretation post-operatively. COMPARISON:  07/08/2019  FINDINGS: Three C-arm views of the right hip demonstrate placement of 3 screws bridging the previously demonstrated right femoral neck fracture with anatomic position and alignment. IMPRESSION: Hardware fixation of the previously demonstrated right femoral neck fracture. Electronically Signed   By: Beckie Salts M.D.   On: 07/09/2019 17:45   DG Hip Unilat  With Pelvis 2-3 Views Right  Result Date: 07/08/2019 CLINICAL DATA:  Initial evaluation for acute trauma, fall. Right hip pain. EXAM: DG HIP (WITH OR WITHOUT PELVIS) 2-3V RIGHT COMPARISON:  None. FINDINGS: Subtle cortical disruption with linear lucency seen extending through the medial aspect of the right femoral neck, consistent with acute nondisplaced right femoral neck fracture. Femoral head remains normally position within the acetabulum. Bony pelvis intact. Limited views of the left hip unremarkable. Degenerative spondylosis noted within the lower lumbar spine. No acute soft tissue abnormality. IMPRESSION:  Acute nondisplaced fracture involving the right femoral neck. Electronically Signed   By: Rise Mu M.D.   On: 07/08/2019 19:26     Subjective: Patient seen and examined this morning.  Reports doing well.  Wants to go home.  Denies any shortness of breath, chest pain, abdominal pain nausea.  Discharge Exam: Vitals:   07/10/19 0951 07/10/19 1345  BP: 102/66 113/72  Pulse: 76 63  Resp: 16 18  Temp: 99 F (37.2 C) 98.5 F (36.9 C)  SpO2: 100% 99%   Vitals:   07/10/19 0242 07/10/19 0621 07/10/19 0951 07/10/19 1345  BP: 103/62 104/66 102/66 113/72  Pulse: 65 66 76 63  Resp: Temp: (!) 97.5 F (36.4 C) 98.2 F (36.8 C) 99 F (37.2 C) 98.5 F (36.9 C)  TempSrc: Oral Oral Oral Oral  SpO2: 100% 100% 100% 99%  Weight:      Height:        General: Pt is alert, awake, not in acute distress Cardiovascular: RRR, S1/S2 +, no rubs, no gallops Respiratory: CTA bilaterally, no wheezing, no rhonchi Abdominal: Soft, NT, ND, bowel sounds + Extremities: no edema, no cyanosis    The results of significant diagnostics from this hospitalization (including imaging, microbiology, ancillary and laboratory) are listed below for reference.     Microbiology: Recent Results (from the past 240 hour(s))  SARS CORONAVIRUS 2 (TAT 6-24 HRS) Nasopharyngeal Nasopharyngeal Swab     Status: None   Collection Time: 07/08/19  8:11 PM   Specimen: Nasopharyngeal Swab  Result Value Ref Range Status   SARS Coronavirus 2 NEGATIVE NEGATIVE Final    Comment: (NOTE) SARS-CoV-2 target nucleic acids are NOT DETECTED. The SARS-CoV-2 RNA is generally detectable in upper and lower respiratory specimens during the acute phase of infection. Negative results do not preclude SARS-CoV-2 infection, do not rule out co-infections with other pathogens, and should not be used as the sole basis for treatment or other patient management decisions. Negative results must be combined with clinical  observations, patient history, and epidemiological information. The expected result is Negative. Fact Sheet for Patients: HairSlick.no Fact Sheet for Healthcare Providers: quierodirigir.com This test is not yet approved or cleared by the [place] FDA and  has been authorized for detection and/or diagnosis of SARS-CoV-2 by FDA under an Emergency Use Authorization (EUA). This EUA will remain  in effect (meaning this test can be used) for the duration of the COVID-19 declaration under Section 56 4(b)(1) of the Act, 21 U.S.C. section 360bbb-3(b)(1), unless the authorization is terminated or revoked sooner. Performed at [facility], [address]  [place], [place] [address]   MRSA PCR Screening     Status: None   Collection Time: 07/09/19 12:05 AM   Specimen: Nasal Mucosa; Nasopharyngeal  Result Value Ref Range Status   MRSA by PCR NEGATIVE NEGATIVE Final    Comment:        The GeneXpert MRSA Assay (FDA approved for NASAL specimens only), is one component of a comprehensive MRSA colonization surveillance program. It is not intended to diagnose MRSA infection nor to guide or monitor treatment for MRSA infections. Performed at [facility], [address], [place], [place] [address]      Labs: BNP (last 3 results) No results for input(s): BNP in the last 8760 hours. Basic Metabolic Panel: Recent Labs  Lab 07/08/19 1909 07/09/19 0442 07/10/19 0519  NA 138 140 139  K 3.7 3.8 4.1  CL 103 104 103  CO2 GLUCOSE 119* 101* 128*  BUN 29* 20 12  CREATININE 1.00 0.83 0.86  CALCIUM 9.2 8.9 8.6*   Liver Function Tests: Recent Labs  Lab 07/09/19 0442  AST 24  ALT 19  ALKPHOS 52  BILITOT 1.1  PROT 6.6  ALBUMIN 3.9   No results for input(s): LIPASE, AMYLASE in the last 168 hours. No results for input(s): AMMONIA in the last 168 hours. CBC: Recent Labs  Lab 07/08/19 1909  07/09/19 0442 07/10/19 0519  WBC 10.0 6.8 7.6  NEUTROABS 7.9*  --   --   HGB 10.9* 10.8* 10.3*  HCT 35.2* 35.2* 34.5*  MCV 85.6 85.2 87.6  PLT 322 287 266   Cardiac Enzymes: No results for input(s): CKTOTAL, CKMB, CKMBINDEX, TROPONINI in the last 168 hours. BNP: Invalid input(s): POCBNP CBG: No results for input(s): GLUCAP in the last 168 hours. D-Dimer No results for input(s): DDIMER in the last 72 hours. Hgb A1c No results for input(s): HGBA1C in the last 72 hours. Lipid Profile No results for input(s): CHOL, HDL, LDLCALC, TRIG, CHOLHDL, LDLDIRECT in the last 72 hours. Thyroid function studies No results for input(s): TSH, T4TOTAL, T3FREE, THYROIDAB in the last 72 hours.  Invalid input(s): FREET3 Anemia work up No results for input(s): VITAMINB12, FOLATE, FERRITIN, TIBC, IRON, RETICCTPCT in the last 72 hours. Urinalysis No results found for: COLORURINE, APPEARANCEUR, LABSPEC, PHURINE, GLUCOSEU, HGBUR, BILIRUBINUR, KETONESUR, PROTEINUR, UROBILINOGEN, NITRITE, LEUKOCYTESUR Sepsis Labs Invalid input(s): PROCALCITONIN,  WBC,  LACTICIDVEN Microbiology Recent Results (from the past 240 hour(s))  SARS CORONAVIRUS 2 (TAT 6-24 HRS) Nasopharyngeal Nasopharyngeal Swab     Status: None   Collection Time: 07/08/19  8:11 PM   Specimen: Nasopharyngeal Swab  Result Value Ref Range Status   SARS Coronavirus 2 NEGATIVE NEGATIVE Final    Comment: (NOTE) SARS-CoV-2 target nucleic acids are NOT DETECTED. The SARS-CoV-2 RNA is generally detectable in upper and lower respiratory specimens during the acute phase of infection. Negative results do not preclude SARS-CoV-2 infection, do not rule out co-infections with other pathogens, and should not be used as the sole basis for treatment or other patient management decisions. Negative results must be combined with clinical observations, patient history, and epidemiological information. The expected result is Negative. Fact Sheet for  Patients: HairSlick.no Fact Sheet for Healthcare Providers: quierodirigir.com This test is not yet approved or cleared by the [place] FDA and  has been authorized for detection and/or diagnosis of SARS-CoV-2 by FDA under an Emergency Use Authorization (EUA). This EUA will remain  in effect (meaning this test can be used) for the duration of  the COVID-19 declaration under Section 56 4(b)(1) of the Act, 21 U.S.C. section 360bbb-3(b)(1), unless the authorization is terminated or revoked sooner. Performed at [facility], [address] [place], [place] [address]   MRSA PCR Screening     Status: None   Collection Time: 07/09/19 12:05 AM   Specimen: Nasal Mucosa; Nasopharyngeal  Result Value Ref Range Status   MRSA by PCR NEGATIVE NEGATIVE Final    Comment:        The GeneXpert MRSA Assay (FDA approved for NASAL specimens only), is one component of a comprehensive MRSA colonization surveillance program. It is not intended to diagnose MRSA infection nor to guide or monitor treatment for MRSA infections. Performed at [facility], [address], [place], [place] [address]      Time coordinating discharge: Over 30 minutes  SIGNED:   Liborio Nixon, MD  [facility] 07/10/2019, 5:16 PM  If 7PM-7AM, please contact night-coverage

## 2019-07-11 NOTE — Anesthesia Postprocedure Evaluation (Signed)
Anesthesia Post Note  Patient: Erin Arnold  Procedure(s) Performed: CANNULATED HIP PINNING (Right Hip)     Patient location during evaluation: PACU Anesthesia Type: General Level of consciousness: awake and alert Pain management: pain level controlled Vital Signs Assessment: post-procedure vital signs reviewed and stable Respiratory status: spontaneous breathing, nonlabored ventilation, respiratory function stable and patient connected to nasal cannula oxygen Cardiovascular status: blood pressure returned to baseline and stable Postop Assessment: no apparent nausea or vomiting Anesthetic complications: no    Last Vitals:  Vitals:   07/10/19 0951 07/10/19 1345  BP: 102/66 113/72  Pulse: 76 63  Resp: 16 18  Temp: 37.2 C 36.9 C  SpO2: 100% 99%    Last Pain:  Vitals:   07/10/19 1345  TempSrc: Oral  PainSc:                  [place]

## 2019-07-23 DIAGNOSIS — S72001A Fracture of unspecified part of neck of right femur, initial encounter for closed fracture: Secondary | ICD-10-CM | POA: Diagnosis not present

## 2019-08-24 DIAGNOSIS — S72001A Fracture of unspecified part of neck of right femur, initial encounter for closed fracture: Secondary | ICD-10-CM | POA: Diagnosis not present

## 2019-09-28 DIAGNOSIS — M170 Bilateral primary osteoarthritis of knee: Secondary | ICD-10-CM | POA: Diagnosis not present

## 2019-09-28 DIAGNOSIS — S72001D Fracture of unspecified part of neck of right femur, subsequent encounter for closed fracture with routine healing: Secondary | ICD-10-CM | POA: Diagnosis not present

## 2019-10-05 DIAGNOSIS — M170 Bilateral primary osteoarthritis of knee: Secondary | ICD-10-CM | POA: Diagnosis not present

## 2019-10-12 DIAGNOSIS — M170 Bilateral primary osteoarthritis of knee: Secondary | ICD-10-CM | POA: Diagnosis not present

## 2019-11-16 ENCOUNTER — Other Ambulatory Visit: Payer: Self-pay | Admitting: Family Medicine

## 2019-11-16 DIAGNOSIS — G479 Sleep disorder, unspecified: Secondary | ICD-10-CM | POA: Diagnosis not present

## 2019-11-16 DIAGNOSIS — Z13820 Encounter for screening for osteoporosis: Secondary | ICD-10-CM | POA: Diagnosis not present

## 2019-11-16 DIAGNOSIS — M170 Bilateral primary osteoarthritis of knee: Secondary | ICD-10-CM | POA: Diagnosis not present

## 2019-11-16 DIAGNOSIS — S72001D Fracture of unspecified part of neck of right femur, subsequent encounter for closed fracture with routine healing: Secondary | ICD-10-CM | POA: Diagnosis not present

## 2019-11-16 DIAGNOSIS — Z13220 Encounter for screening for lipoid disorders: Secondary | ICD-10-CM | POA: Diagnosis not present

## 2019-11-16 DIAGNOSIS — R454 Irritability and anger: Secondary | ICD-10-CM | POA: Diagnosis not present

## 2019-11-16 DIAGNOSIS — Z131 Encounter for screening for diabetes mellitus: Secondary | ICD-10-CM | POA: Diagnosis not present

## 2019-12-25 IMAGING — CR DG HIP (WITH OR WITHOUT PELVIS) 2-3V*R*
3 series · 3 of 3 positions shown · non-contrast
Comparison: None.

CLINICAL DATA: Initial evaluation for acute trauma, fall. Right hip
pain.

EXAM:
DG HIP (WITH OR WITHOUT PELVIS) 2-3V RIGHT

[x pelvis (1 of 2)]
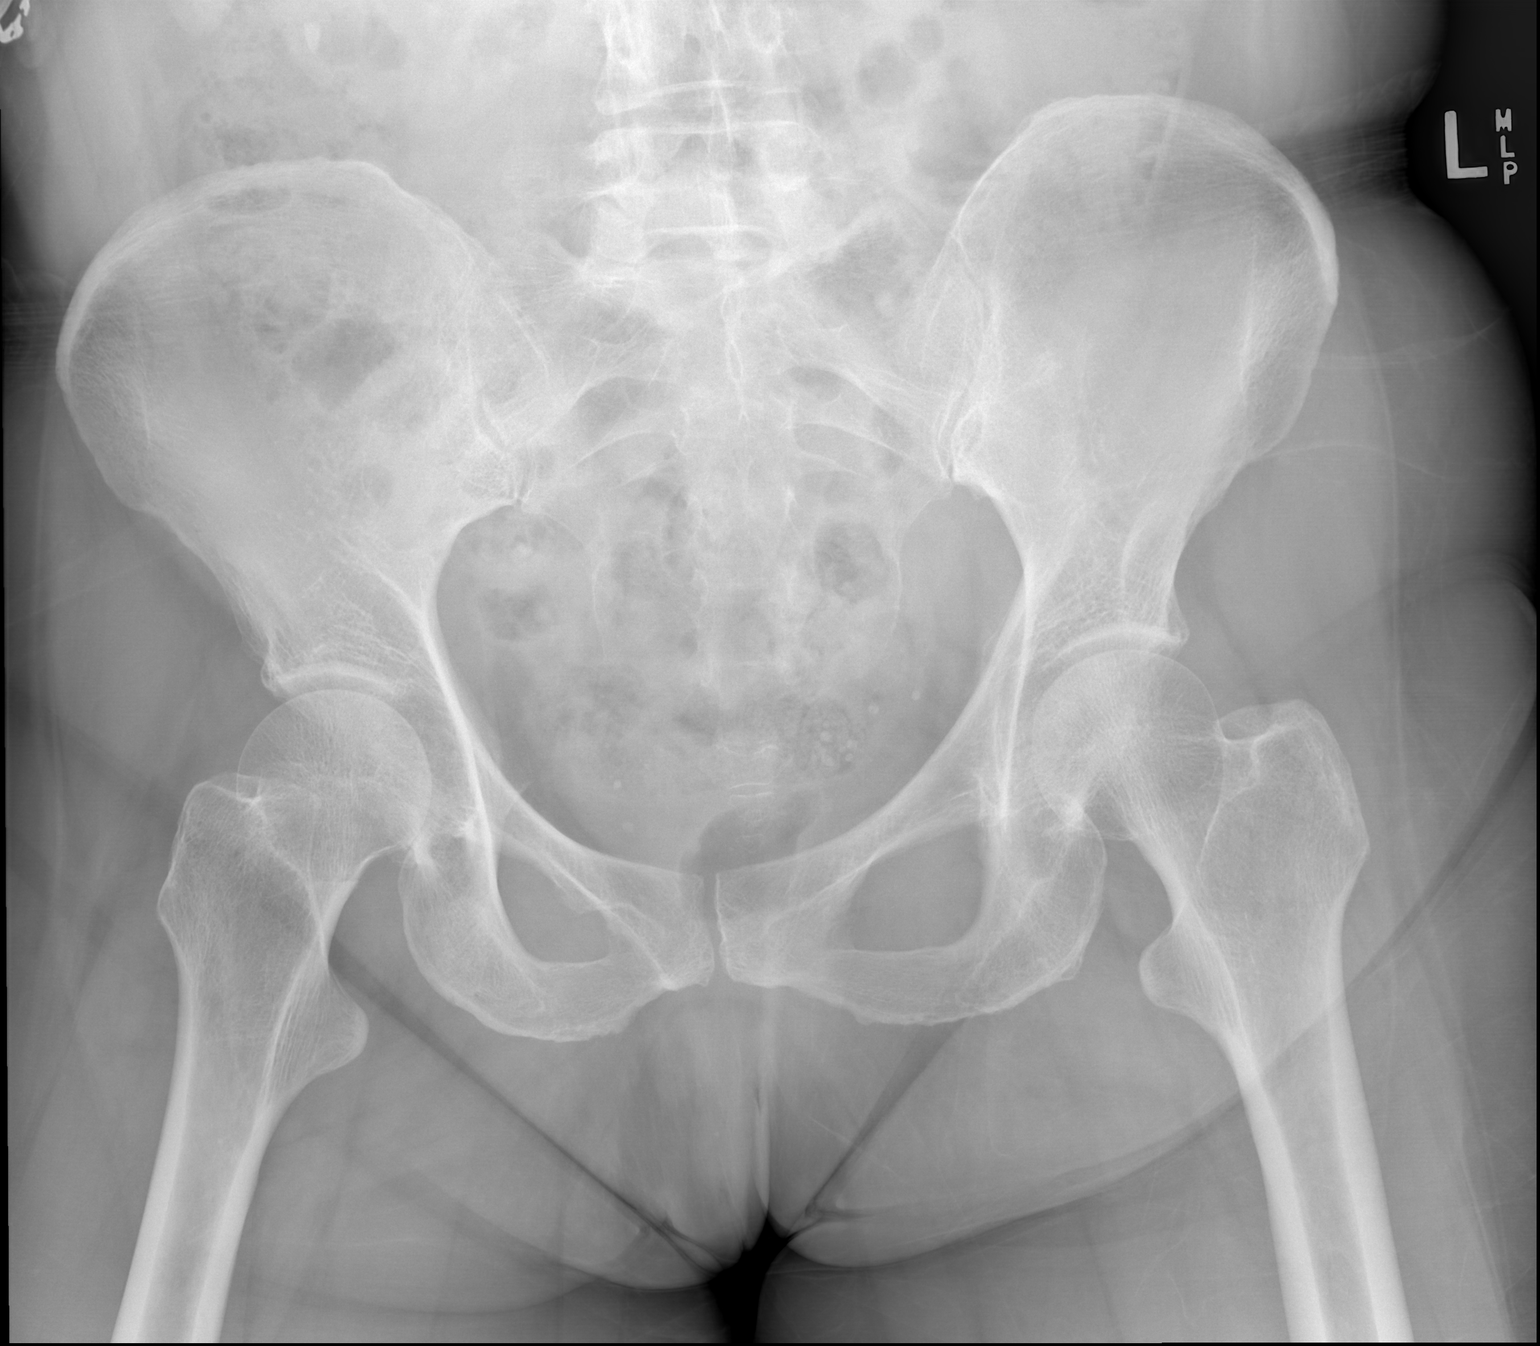

[x pelvis (2 of 2)]
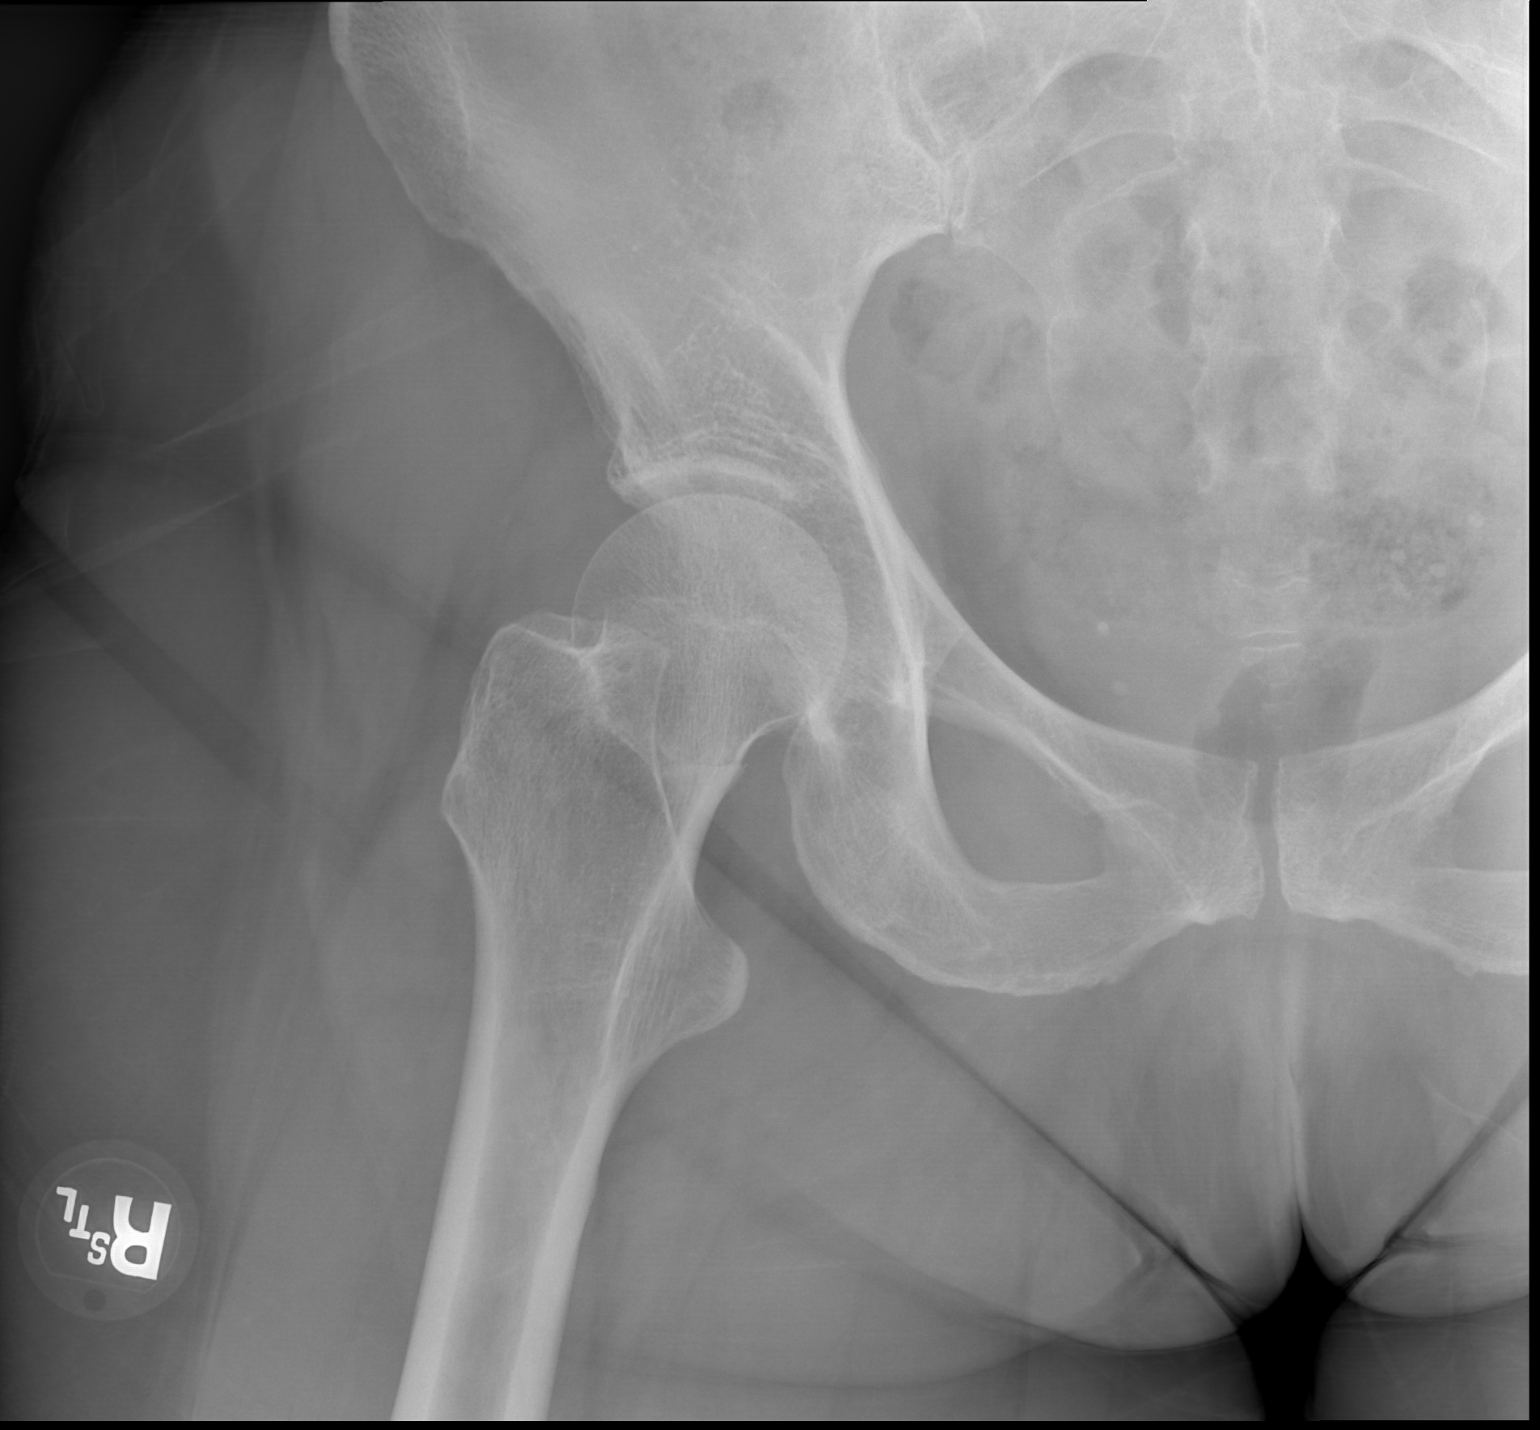

[w hip lat right]
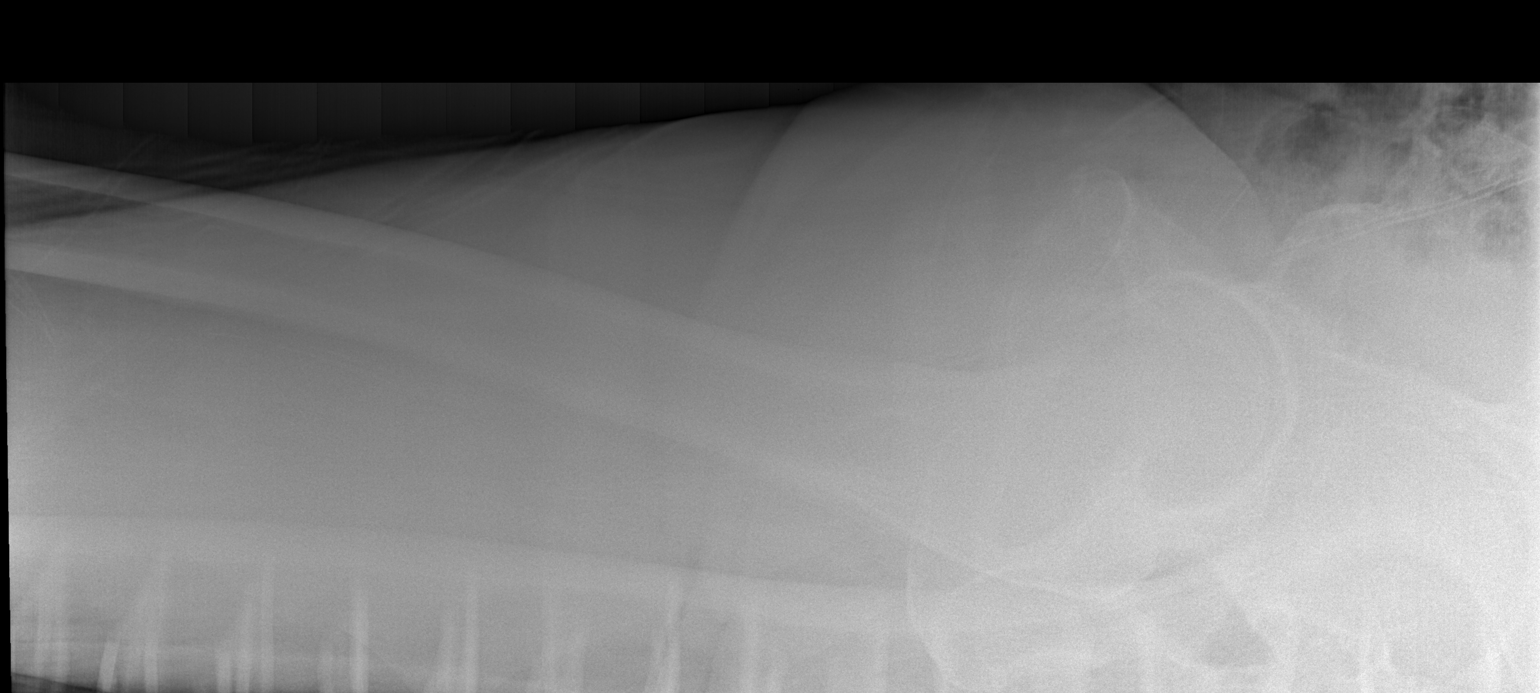

[3 of 3 positions shown; findings below may reference images not displayed]

FINDINGS: Subtle cortical disruption with linear lucency seen extending
through the medial aspect of the right femoral neck, consistent with
acute nondisplaced right femoral neck fracture. Femoral head remains
normally position within the acetabulum. Bony pelvis intact. Limited
views of the left hip unremarkable.

Degenerative spondylosis noted within the lower lumbar spine.

No acute soft tissue abnormality.
IMPRESSION: Acute nondisplaced fracture involving the right femoral neck.

## 2019-12-25 IMAGING — CR DG CHEST 1V
1 series · 1 of 1 positions shown · non-contrast
Comparison: None.

CLINICAL DATA: Pt presents from home after she had a fall. Pt was
distracted and fell onto her right side. Pt believes that she has a
hip fracture. Pt a/o x 4. Pre op for right hip fracture. No chest
complaints.

EXAM:
CHEST  1 VIEW

[x chest ap]
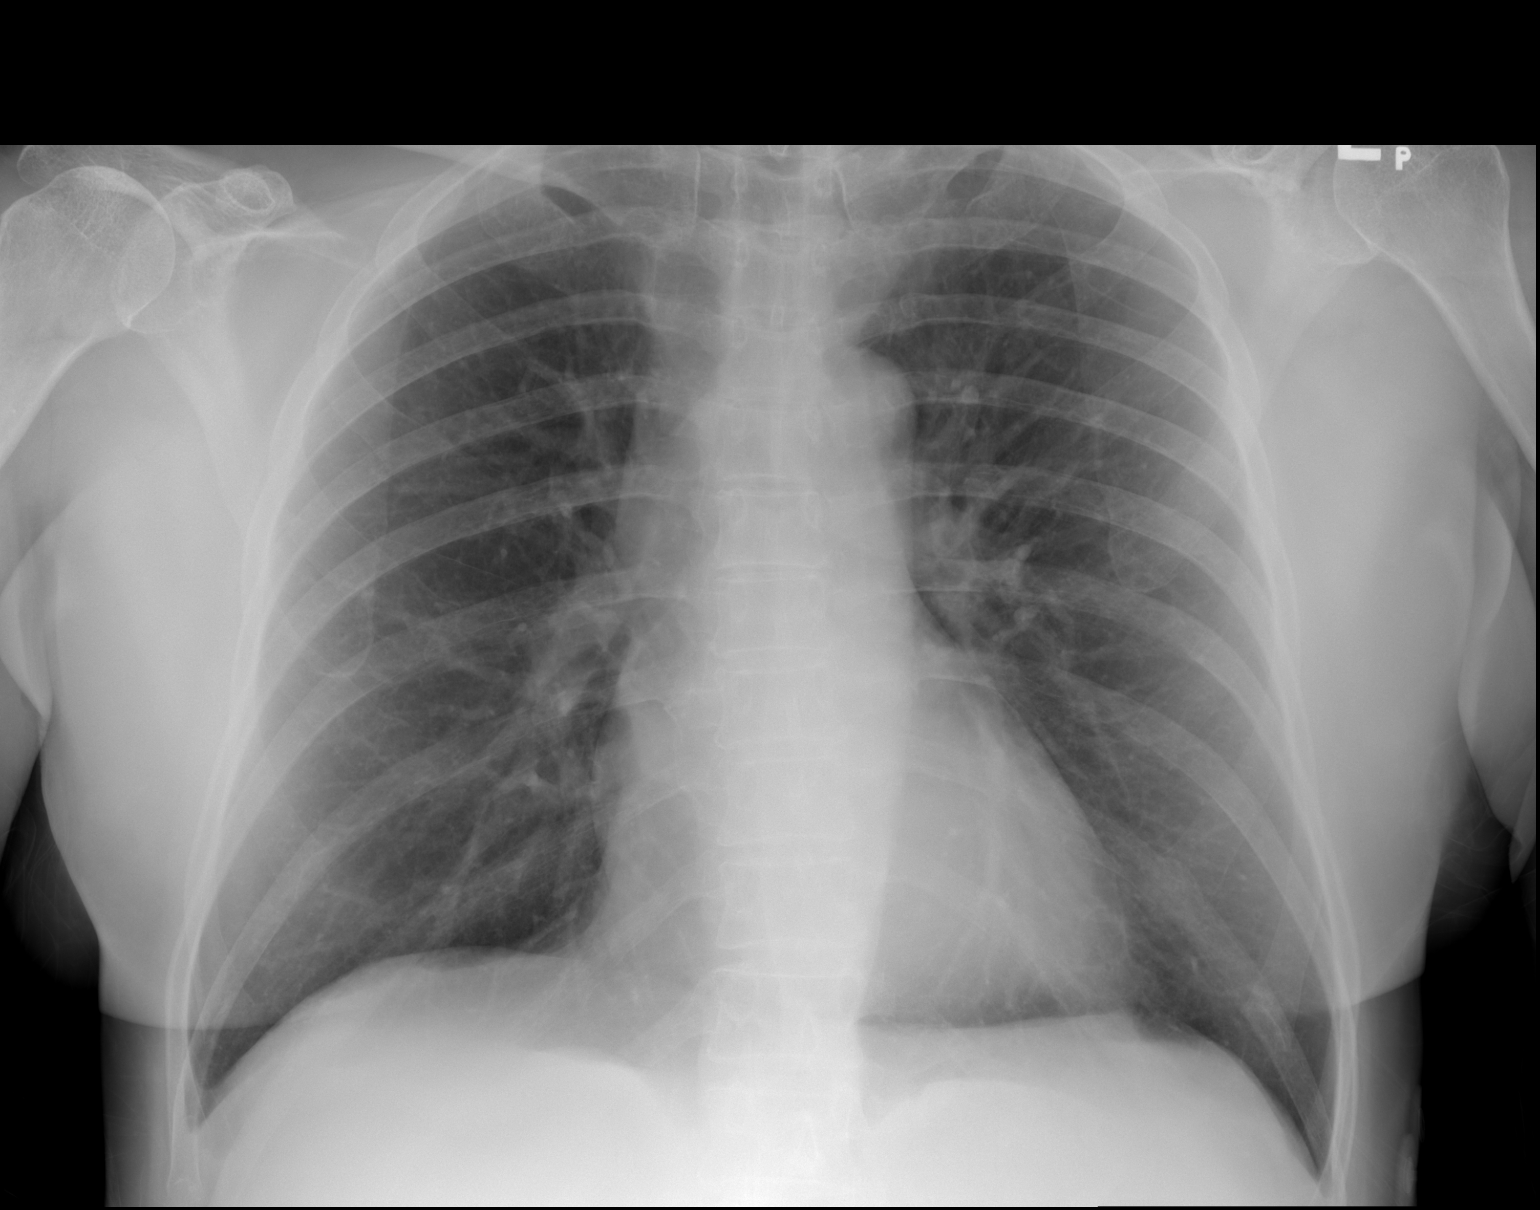

[1 of 1 positions shown; findings below may reference images not displayed]

FINDINGS: The heart size and mediastinal contours are within normal limits.
The lungs are clear. No pneumothorax or large pleural effusion. The
visualized skeletal structures are unremarkable.
IMPRESSION: No active cardiopulmonary disease.

## 2019-12-26 IMAGING — DX DG PORTABLE PELVIS
1 series · 1 of 1 positions shown · non-contrast
Comparison: 07/08/2019

CLINICAL DATA: Post right hip pinning

EXAM:
PORTABLE PELVIS 1-2 VIEWS

[pelvis ap]
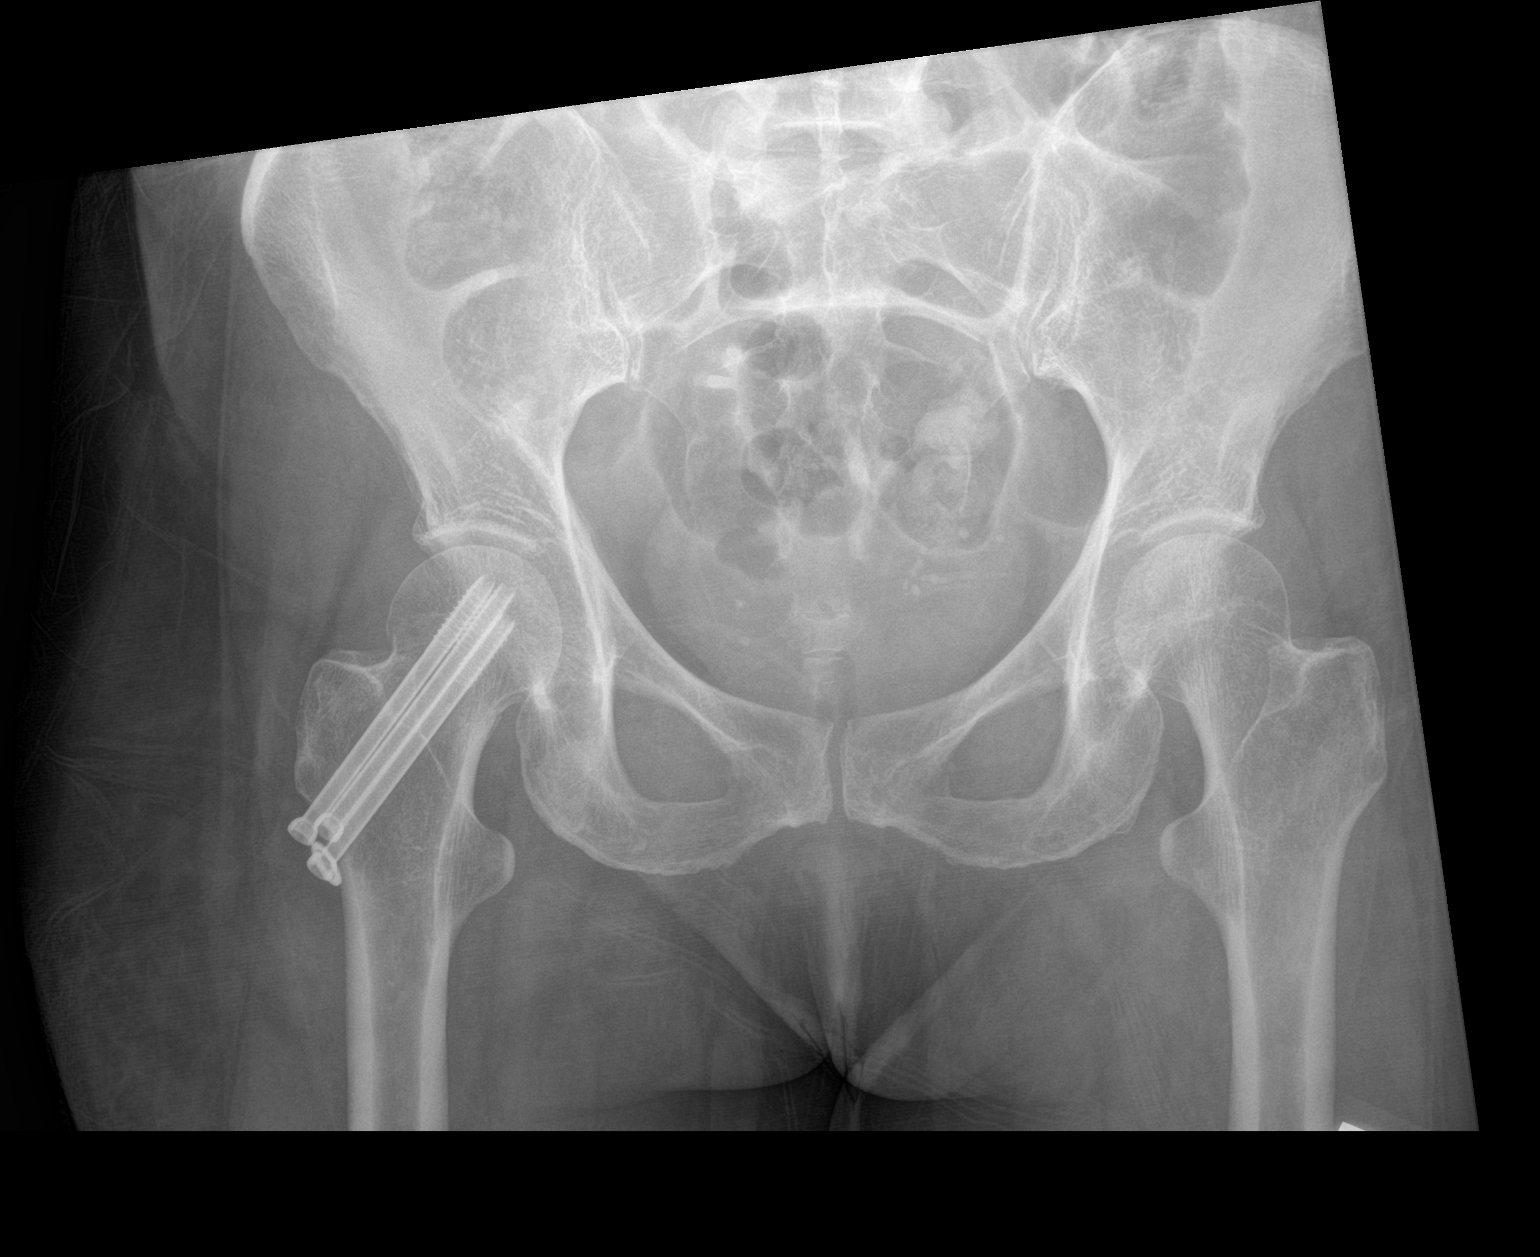

[1 of 1 positions shown; findings below may reference images not displayed]

FINDINGS: Interval the right femoral neck pinning in the proximal right femur
across fracture. No hardware or bony complicating feature.
IMPRESSION: Right proximal femoral pinning.  No complicating feature.

## 2020-02-19 ENCOUNTER — Other Ambulatory Visit: Payer: BC Managed Care – PPO

## 2020-04-18 ENCOUNTER — Other Ambulatory Visit: Payer: Self-pay

## 2020-04-18 ENCOUNTER — Ambulatory Visit
Admission: RE | Admit: 2020-04-18 | Discharge: 2020-04-18 | Disposition: A | Discharge status: Home / Self Care | Payer: BC Managed Care – PPO | Source: Ambulatory Visit | Attending: Family Medicine | Admitting: Family Medicine

## 2020-04-18 DIAGNOSIS — Z780 Asymptomatic menopausal state: Secondary | ICD-10-CM | POA: Diagnosis not present

## 2020-04-18 DIAGNOSIS — M85852 Other specified disorders of bone density and structure, left thigh: Secondary | ICD-10-CM | POA: Diagnosis not present

## 2020-04-18 DIAGNOSIS — Z13820 Encounter for screening for osteoporosis: Secondary | ICD-10-CM

## 2020-06-13 DIAGNOSIS — Z1231 Encounter for screening mammogram for malignant neoplasm of breast: Secondary | ICD-10-CM | POA: Diagnosis not present

## 2020-06-13 DIAGNOSIS — Z01419 Encounter for gynecological examination (general) (routine) without abnormal findings: Secondary | ICD-10-CM | POA: Diagnosis not present

## 2020-07-11 DIAGNOSIS — H6983 Other specified disorders of Eustachian tube, bilateral: Secondary | ICD-10-CM | POA: Diagnosis not present

## 2020-07-11 DIAGNOSIS — H903 Sensorineural hearing loss, bilateral: Secondary | ICD-10-CM | POA: Diagnosis not present

## 2020-08-08 DIAGNOSIS — M1711 Unilateral primary osteoarthritis, right knee: Secondary | ICD-10-CM | POA: Diagnosis not present

## 2020-08-08 DIAGNOSIS — M545 Low back pain, unspecified: Secondary | ICD-10-CM | POA: Diagnosis not present

## 2020-08-08 DIAGNOSIS — M25551 Pain in right hip: Secondary | ICD-10-CM | POA: Diagnosis not present

## 2020-08-18 DIAGNOSIS — Z01812 Encounter for preprocedural laboratory examination: Secondary | ICD-10-CM | POA: Diagnosis not present

## 2020-08-18 DIAGNOSIS — M25561 Pain in right knee: Secondary | ICD-10-CM | POA: Diagnosis not present

## 2020-08-18 DIAGNOSIS — M1711 Unilateral primary osteoarthritis, right knee: Secondary | ICD-10-CM | POA: Diagnosis not present

## 2020-08-22 DIAGNOSIS — M179 Osteoarthritis of knee, unspecified: Secondary | ICD-10-CM | POA: Diagnosis not present

## 2020-08-22 DIAGNOSIS — Z01818 Encounter for other preprocedural examination: Secondary | ICD-10-CM | POA: Diagnosis not present

## 2020-08-22 DIAGNOSIS — R454 Irritability and anger: Secondary | ICD-10-CM | POA: Diagnosis not present

## 2020-08-22 DIAGNOSIS — Z01810 Encounter for preprocedural cardiovascular examination: Secondary | ICD-10-CM | POA: Diagnosis not present

## 2020-09-15 DIAGNOSIS — M1711 Unilateral primary osteoarthritis, right knee: Secondary | ICD-10-CM | POA: Diagnosis not present

## 2020-09-19 ENCOUNTER — Encounter: Payer: Self-pay | Admitting: Plastic Surgery

## 2020-09-19 ENCOUNTER — Other Ambulatory Visit: Payer: Self-pay

## 2020-09-19 ENCOUNTER — Ambulatory Visit (INDEPENDENT_AMBULATORY_CARE_PROVIDER_SITE_OTHER): Payer: Self-pay | Admitting: Plastic Surgery

## 2020-09-19 DIAGNOSIS — Z719 Counseling, unspecified: Secondary | ICD-10-CM

## 2020-09-19 NOTE — Progress Notes (Signed)
Botulinum Toxin and Filler Injection Procedure Note  Procedure: Cosmetic botulinum toxin and Filler administration  Pre-operative Diagnosis: Dynamic rhytides and midface volume loss  Post-operative Diagnosis: Same  Complications:  None  Brief history: The patient desires botulinum toxin injection of her forehead. I discussed with the patient this proposed procedure of botulinum toxin injections, which is customized depending on the particular needs of the patient. It is performed on facial rhytids as a temporary correction. The alternatives were discussed with the patient. The risks were addressed including bleeding, scarring, infection, damage to deeper structures, asymmetry, and chronic pain, which may occur infrequently after a procedure. The individual's choice to undergo a surgical procedure is based on the comparison of risks to potential benefits. Other risks include unsatisfactory results, brow ptosis, eyelid ptosis, allergic reaction, temporary paralysis, which should go away with time, bruising, blurring disturbances and delayed healing. Botulinum toxin injections do not arrest the aging process or produce permanent tightening of the eyelid.  Operative intervention maybe necessary to maintain the results of a blepharoplasty or botulinum toxin. The patient understands and wishes to proceed.  Procedure: The area was prepped with alcohol and dried with a clean gauze. Using a clean technique, the botulinum toxin was diluted with 1.25 cc of preservative-free normal saline which was slowly injected with an 18 gauge needle in a tuberculin syringes.  A 32 gauge needles were then used to inject the botulinum toxin. This mixture allow for an aliquot of 5 units per 0.1 cc in each injection site.    Subsequently the mixture was injected in the glabellar and forehead area with preservation of the temporal branch to the lateral eyebrow as well as into each lateral canthal area beginning from the lateral  orbital rim medial to the zygomaticus major in 3 separate areas. A total of 40 Units of botulinum toxin was used. The forehead and glabellar area was injected with care to inject intramuscular only while holding pressure on the supratrochlear vessels in each area during each injection on either side of the medial corrugators. The injection proceeded vertically superiorly to the medial 2/3 of the frontalis muscle and superior 2/3 of the lateral frontalis, again with preservation of the frontal branch.  The midface area was injected at the 3 sub-regions of the mid-face: zygomaticomalar region, anteromedial cheek region, and submalar region for a total of one syringe on each side of the face. The technique used was serial puncture with equal injections in the 3 sub-regions: the zygomaticomalar region, the anteromedial cheek, and the submalar region.  No complications were noted. Light pressure was held for 5 minutes. She was instructed explicitly in post-operative care.  Botox LOT:  [id number] EXP:  4/24  J. Voluma XC x 2 LOT: [id number] EXP: 2021-08-12  Juvederm Ultra Plus LOT: [id number] EXP: 2020-12-31

## 2020-09-26 DIAGNOSIS — M25661 Stiffness of right knee, not elsewhere classified: Secondary | ICD-10-CM | POA: Diagnosis not present

## 2020-09-26 DIAGNOSIS — M1711 Unilateral primary osteoarthritis, right knee: Secondary | ICD-10-CM | POA: Diagnosis not present

## 2020-09-26 DIAGNOSIS — M25561 Pain in right knee: Secondary | ICD-10-CM | POA: Diagnosis not present

## 2020-09-26 DIAGNOSIS — M6281 Muscle weakness (generalized): Secondary | ICD-10-CM | POA: Diagnosis not present

## 2020-10-02 DIAGNOSIS — M1711 Unilateral primary osteoarthritis, right knee: Secondary | ICD-10-CM | POA: Diagnosis not present

## 2020-10-10 ENCOUNTER — Other Ambulatory Visit (HOSPITAL_COMMUNITY): Payer: Self-pay | Admitting: Orthopedic Surgery

## 2020-10-10 ENCOUNTER — Ambulatory Visit (HOSPITAL_COMMUNITY)
Admission: RE | Admit: 2020-10-10 | Discharge: 2020-10-10 | Disposition: A | Discharge status: Home / Self Care | Payer: BC Managed Care – PPO | Source: Ambulatory Visit | Attending: Orthopedic Surgery | Admitting: Orthopedic Surgery

## 2020-10-10 ENCOUNTER — Other Ambulatory Visit: Payer: Self-pay

## 2020-10-10 DIAGNOSIS — R600 Localized edema: Secondary | ICD-10-CM | POA: Diagnosis not present

## 2020-10-10 DIAGNOSIS — M79605 Pain in left leg: Secondary | ICD-10-CM

## 2020-10-10 DIAGNOSIS — M79604 Pain in right leg: Secondary | ICD-10-CM | POA: Insufficient documentation

## 2020-10-10 NOTE — Progress Notes (Signed)
RLE venous duplex       has been completed. Preliminary results can be found under CV proc through chart review. Jeb Levering, BS, RDMS, RVT  Called report to [facility]

## 2020-10-31 DIAGNOSIS — M7061 Trochanteric bursitis, right hip: Secondary | ICD-10-CM | POA: Diagnosis not present

## 2020-11-14 DIAGNOSIS — M25551 Pain in right hip: Secondary | ICD-10-CM | POA: Diagnosis not present

## 2020-11-20 DIAGNOSIS — M25551 Pain in right hip: Secondary | ICD-10-CM | POA: Diagnosis not present

## 2020-11-28 DIAGNOSIS — R262 Difficulty in walking, not elsewhere classified: Secondary | ICD-10-CM | POA: Diagnosis not present

## 2020-11-28 DIAGNOSIS — M1711 Unilateral primary osteoarthritis, right knee: Secondary | ICD-10-CM | POA: Diagnosis not present

## 2020-11-28 DIAGNOSIS — T8484XA Pain due to internal orthopedic prosthetic devices, implants and grafts, initial encounter: Secondary | ICD-10-CM | POA: Diagnosis not present

## 2020-11-28 DIAGNOSIS — M6281 Muscle weakness (generalized): Secondary | ICD-10-CM | POA: Diagnosis not present

## 2020-11-28 DIAGNOSIS — M25661 Stiffness of right knee, not elsewhere classified: Secondary | ICD-10-CM | POA: Diagnosis not present

## 2021-01-15 DIAGNOSIS — T8484XA Pain due to internal orthopedic prosthetic devices, implants and grafts, initial encounter: Secondary | ICD-10-CM | POA: Diagnosis not present

## 2021-01-15 DIAGNOSIS — T84498A Other mechanical complication of other internal orthopedic devices, implants and grafts, initial encounter: Secondary | ICD-10-CM | POA: Diagnosis not present

## 2021-01-27 DIAGNOSIS — M25551 Pain in right hip: Secondary | ICD-10-CM | POA: Diagnosis not present

## 2021-02-05 DIAGNOSIS — S72001D Fracture of unspecified part of neck of right femur, subsequent encounter for closed fracture with routine healing: Secondary | ICD-10-CM | POA: Diagnosis not present

## 2021-02-05 DIAGNOSIS — M25551 Pain in right hip: Secondary | ICD-10-CM | POA: Diagnosis not present

## 2021-02-05 DIAGNOSIS — M6281 Muscle weakness (generalized): Secondary | ICD-10-CM | POA: Diagnosis not present

## 2021-02-11 DIAGNOSIS — M6281 Muscle weakness (generalized): Secondary | ICD-10-CM | POA: Diagnosis not present

## 2021-02-11 DIAGNOSIS — S72001D Fracture of unspecified part of neck of right femur, subsequent encounter for closed fracture with routine healing: Secondary | ICD-10-CM | POA: Diagnosis not present

## 2021-02-11 DIAGNOSIS — M25551 Pain in right hip: Secondary | ICD-10-CM | POA: Diagnosis not present

## 2021-02-13 DIAGNOSIS — S72001D Fracture of unspecified part of neck of right femur, subsequent encounter for closed fracture with routine healing: Secondary | ICD-10-CM | POA: Diagnosis not present

## 2021-02-13 DIAGNOSIS — M6281 Muscle weakness (generalized): Secondary | ICD-10-CM | POA: Diagnosis not present

## 2021-02-13 DIAGNOSIS — M25551 Pain in right hip: Secondary | ICD-10-CM | POA: Diagnosis not present

## 2021-02-17 DIAGNOSIS — M25551 Pain in right hip: Secondary | ICD-10-CM | POA: Diagnosis not present

## 2021-02-17 DIAGNOSIS — M6281 Muscle weakness (generalized): Secondary | ICD-10-CM | POA: Diagnosis not present

## 2021-02-17 DIAGNOSIS — S72001D Fracture of unspecified part of neck of right femur, subsequent encounter for closed fracture with routine healing: Secondary | ICD-10-CM | POA: Diagnosis not present

## 2021-02-19 DIAGNOSIS — M25551 Pain in right hip: Secondary | ICD-10-CM | POA: Diagnosis not present

## 2021-02-19 DIAGNOSIS — S72001D Fracture of unspecified part of neck of right femur, subsequent encounter for closed fracture with routine healing: Secondary | ICD-10-CM | POA: Diagnosis not present

## 2021-02-19 DIAGNOSIS — M6281 Muscle weakness (generalized): Secondary | ICD-10-CM | POA: Diagnosis not present

## 2021-02-25 DIAGNOSIS — M25551 Pain in right hip: Secondary | ICD-10-CM | POA: Diagnosis not present

## 2021-02-25 DIAGNOSIS — M1712 Unilateral primary osteoarthritis, left knee: Secondary | ICD-10-CM | POA: Diagnosis not present

## 2021-04-10 DIAGNOSIS — M1712 Unilateral primary osteoarthritis, left knee: Secondary | ICD-10-CM | POA: Diagnosis not present

## 2021-05-01 ENCOUNTER — Encounter: Payer: Self-pay | Admitting: Plastic Surgery

## 2021-05-01 ENCOUNTER — Ambulatory Visit (INDEPENDENT_AMBULATORY_CARE_PROVIDER_SITE_OTHER): Payer: Self-pay | Admitting: Plastic Surgery

## 2021-05-01 ENCOUNTER — Other Ambulatory Visit: Payer: Self-pay

## 2021-05-01 VITALS — BP 109/70 | HR 75 | Ht 67.0 in | Wt 181.0 lb

## 2021-05-01 DIAGNOSIS — Z719 Counseling, unspecified: Secondary | ICD-10-CM

## 2021-05-01 NOTE — Progress Notes (Signed)
.  csbo Botulinum Toxin and Filler Injection Procedure Note  Procedure: Cosmetic botulinum toxin and Filler administration  Pre-operative Diagnosis: Dynamic rhytides and midface volume loss  Post-operative Diagnosis: Same  Complications:  None  Brief history: The patient desires botulinum toxin injection of her forehead. I discussed with the patient this proposed procedure of botulinum toxin injections, which is customized depending on the particular needs of the patient. It is performed on facial rhytids as a temporary correction. The alternatives were discussed with the patient. The risks were addressed including bleeding, scarring, infection, damage to deeper structures, asymmetry, and chronic pain, which may occur infrequently after a procedure. The individual's choice to undergo a surgical procedure is based on the comparison of risks to potential benefits. Other risks include unsatisfactory results, brow ptosis, eyelid ptosis, allergic reaction, temporary paralysis, which should go away with time, bruising, blurring disturbances and delayed healing. Botulinum toxin injections do not arrest the aging process or produce permanent tightening of the eyelid.  Operative intervention maybe necessary to maintain the results of a blepharoplasty or botulinum toxin. The patient understands and wishes to proceed.  Procedure: The area was prepped with alcohol and dried with a clean gauze. Using a clean technique, the botulinum toxin was diluted with 1.25 cc of preservative-free normal saline which was slowly injected with an 18 gauge needle in a tuberculin syringes.  A 32 gauge needles were then used to inject the botulinum toxin. This mixture allow for an aliquot of 4 units per 0.1 cc in each injection site.    Subsequently the mixture was injected in the glabellar and forehead area with preservation of the temporal branch to the lateral eyebrow as well as into each lateral canthal area beginning from the  lateral orbital rim medial to the zygomaticus major in 3 separate areas. A total of 40 Units of botulinum toxin was used. The forehead and glabellar area was injected with care to inject intramuscular only while holding pressure on the supratrochlear vessels in each area during each injection on either side of the medial corrugators. The injection proceeded vertically superiorly to the medial 2/3 of the frontalis muscle and superior 2/3 of the lateral frontalis, again with preservation of the frontal branch.  The midface area was injected at the 3 sub-regions of the mid-face: zygomaticomalar region, anteromedial cheek region, and submalar region for a total of one syringe. The technique used was serial puncture with equal injections in the 3 sub-regions: the zygomaticomalar region, the anteromedial cheek, and the submalar region. The lateral oral wrinkles were then injected with the UltraPlus XC.   No complications were noted. Light pressure was held for 5 minutes. She was instructed explicitly in post-operative care.  Botox LOT:  [id number] EXP:  7/24  J. Voluma XC x 1 LOT: [id number] EXP: 2022-01-17  J UltraPlus XC 0.55 ml LOT: [id number] EXP: 2021-11-04

## 2021-06-08 DIAGNOSIS — Z01812 Encounter for preprocedural laboratory examination: Secondary | ICD-10-CM | POA: Diagnosis not present

## 2021-06-08 DIAGNOSIS — M25562 Pain in left knee: Secondary | ICD-10-CM | POA: Diagnosis not present

## 2021-06-08 DIAGNOSIS — M1712 Unilateral primary osteoarthritis, left knee: Secondary | ICD-10-CM | POA: Diagnosis not present

## 2021-06-21 ENCOUNTER — Encounter: Payer: Self-pay | Admitting: Cardiovascular Disease

## 2021-06-21 NOTE — Progress Notes (Signed)
Erin Arnold Date of Birth  1956-04-24 [facility]     [place] Office  [address]    [address]   [address] [place], [place]  [address]    [place], [place]  [address] [phone]  Fax  [phone]  [phone]  Fax [phone]  Problem List: 1. Palpitations 2. Hx of colitis - following a C.Diff infection    Erin Arnold is a 65 yo with a history of palpitations. She's had these palpitations for several years. These are typically described as heart irregularities followed by very strong heartbeats. On one occasion she's had a prolonged episode of palpitations.  These typically occur at night when she's relaxing. They also occur during the day. She does  not have any specific association for these palpitations. These episodes are associated with syncope, presyncope, chest pain, or shortness breath.  She has not had any problems.  Her palpitations have been well controlled. She is scheduled to have arm surgery.    November 10, 2012:  Erin Arnold is doing well from a cardiac standpoint.  She still have occasional palps.   She has had left radial nerve decompression  06/23/2015:  Doing well.   Here to get her Toprol 12.5 mg  refilled.  No side effects.  Exercising regularly .    April 28, 2018: Erin Arnold seen back today for further evaluation of her palpitations.  She is having some lots of orthopedic issues. Has brief episodes of palpitations.  These last for typically only 1 second.  No prolonged episodes of heartbeat irregularities. Not exercising regularly.  She is limited by her orthopedic issues. Has not been taking her Toprol  -   Nov. 28, 2022 Erin Arnold is seen today for follow up  of her palpitations. She was last seen 3 years ago  Very rare palpitations. Last for a split second  - clinically sound like a PVC More frequent at night when she is is relaxing  No  CP , no dyspnea Is having L knee replacement surgery on Thursday  She is at low risk for for her  upcoming surgery .   Has already had R TKA in March, 2022  Current Outpatient Medications on File Prior to Visit  Medication Sig Dispense Refill   albuterol (PROVENTIL HFA;VENTOLIN HFA) 108 (90 Base) MCG/ACT inhaler Inhale 2 puffs into the lungs every 6 (six) hours as needed for wheezing or shortness of breath. 1 Inhaler 2   CA-MG-VIT D-L METHYLFOL-B6-B12 PO Take 1 tablet by mouth daily.      Coenzyme Q10 10 MG capsule Take 1 capsule by mouth daily.     LYSINE PO Take 1 mg by mouth daily.      magnesium 30 MG tablet Take 30 mg by mouth daily.      Menaquinone-7 (VITAMIN K2) 100 MCG CAPS Take 100 mcg by mouth daily.     Misc Natural Products (OSTEO BI-FLEX ADV JOINT SHIELD PO) Take 1 tablet by mouth daily.      Multiple Vitamins-Minerals (ZINC PO) Take 1 tablet by mouth daily.      Probiotic Product (PROBIOTIC FORMULA PO) Take 1 tablet by mouth daily.      sennosides-docusate sodium (SENOKOT-S) 8.6-50 MG tablet Take 2 tablets by mouth daily. 30 tablet 1   sertraline (ZOLOFT) 50 MG tablet Take 25 mg by mouth daily.      traZODone (DESYREL) 50 MG tablet Take 50 mg by mouth at bedtime as needed for sleep.  Turmeric 500 MG CAPS Take 500 mg by mouth daily.      valACYclovir (VALTREX) 1000 MG tablet Take 500 mg by mouth daily as needed (flare ups).     Ascorbic Acid (VITAMIN C PO) Take 1 tablet by mouth daily.  (Patient not taking: Reported on 06/22/2021)     baclofen (LIORESAL) 10 MG tablet Take 1 tablet (10 mg total) by mouth 3 (three) times daily. As needed for muscle spasm (Patient not taking: Reported on 06/22/2021) 50 tablet 0   HYDROcodone-acetaminophen (NORCO) 5-325 MG tablet Take 1-2 tablets by mouth every 6 (six) hours as needed for moderate pain. MAXIMUM TOTAL ACETAMINOPHEN DOSE IS 4000 MG PER DAY (Patient not taking: Reported on 06/22/2021) 30 tablet 0   No current facility-administered medications on file prior to visit.    No Known Allergies  Past Medical History:  Diagnosis  Date   Carpal tunnel syndrome of right wrist 05/2013   Constipation    occasional   GERD (gastroesophageal reflux disease)    OTC as needed   Headache(784.0)    sinus   History of colitis    History of palpitations    under control with Metoprolol   PMDD (premenstrual dysphoric disorder)    takes HCTZ to control sx.   PONV (postoperative nausea and vomiting)    Seasonal allergies     Past Surgical History:  Procedure Laterality Date   CARPAL TUNNEL RELEASE Right 06/14/2013   Procedure: CARPAL TUNNEL RELEASE; RIGHT ;  Surgeon: Ninetta Lights, MD;  Location: [place];  Service: Orthopedics;  Laterality: Right;   ELBOW ARTHROSCOPY Left 02/08/2013   Procedure: LEFT ELBOW ARTHROSCOPY WITH DEBRIDEMENT LIMITED/ TENNIS ELBOW  REPAIR, PARTIAL SYNOVECTOMY AND CHONDROPLASTY;  Surgeon: Ninetta Lights, MD;  Location: [place];  Service: Orthopedics;  Laterality: Left;   HIP PINNING,CANNULATED Right 07/09/2019   Procedure: CANNULATED HIP PINNING;  Surgeon: Marchia Bond, MD;  Location: [facility];  Service: Orthopedics;  Laterality: Right;   KNEE ARTHROSCOPY W/ ACL RECONSTRUCTION Left 02/18/2011   x 2   WRIST SURGERY Left 05/26/2012   radial nerve decompression    Social History   Tobacco Use  Smoking Status Former   Types: Cigarettes   Quit date: 07/26/1976   Years since quitting: 44.9  Smokeless Tobacco Never    Social History   Substance and Sexual Activity  Alcohol Use Yes   Comment: occasional beer    History reviewed. No pertinent family history.  Reviw of Systems:  Reviewed in the HPI.  All other systems are negative.  Physical Exam: Blood pressure 108/72, pulse 74, height 5\' 7"  (1.702 m), weight 183 lb 12.8 oz (83.4 kg), SpO2 98 %.  GEN:  Well nourished, well developed in no acute distress HEENT: Normal NECK: No JVD; No carotid bruits LYMPHATICS: No lymphadenopathy CARDIAC: RRR , no murmurs, rubs, gallops RESPIRATORY:  Clear to  auscultation without rales, wheezing or rhonchi  ABDOMEN: Soft, non-tender, non-distended MUSCULOSKELETAL:  No edema; No deformity  SKIN: Warm and dry NEUROLOGIC:  Alert and oriented x 3  ECG:  Nov. 28, 2022:   NSR, occas PVC    Assessment / Plan:   1. Palpitations: She has rare premature ventricular contractions.  These are fairly benign.  2. Hyperlipidemia:   She has had mild hyperlipidemia for years.  She does not have a strong family history of coronary cardiac disease.  I will suggest that we get a coronary calcium score for risk assessment. If  her coronary calcium score is high then I still think that she will need to be put on a standard medication.  She does not want to take a statin but I suggested that if she does not tolerate a statin after a several week trial then we could refer her for a PCSK 9 inhibitor or Inclarsarin or bempadoic acid theraphy  Will see her in year year.    Mertie Moores, MD  06/22/2021 5:00 PM    [address],  [place],   [address] Pager [phone] Phone: [phone]; Fax: [phone]

## 2021-06-22 ENCOUNTER — Encounter: Payer: Self-pay | Admitting: Cardiovascular Disease

## 2021-06-22 ENCOUNTER — Ambulatory Visit (INDEPENDENT_AMBULATORY_CARE_PROVIDER_SITE_OTHER): Payer: BC Managed Care – PPO | Admitting: Cardiovascular Disease

## 2021-06-22 ENCOUNTER — Other Ambulatory Visit: Payer: Self-pay

## 2021-06-22 VITALS — BP 108/72 | HR 74 | Ht 67.0 in | Wt 183.8 lb

## 2021-06-22 DIAGNOSIS — E785 Hyperlipidemia, unspecified: Secondary | ICD-10-CM

## 2021-06-22 DIAGNOSIS — M25562 Pain in left knee: Secondary | ICD-10-CM | POA: Diagnosis not present

## 2021-06-22 DIAGNOSIS — Z01419 Encounter for gynecological examination (general) (routine) without abnormal findings: Secondary | ICD-10-CM | POA: Diagnosis not present

## 2021-06-22 DIAGNOSIS — Z1231 Encounter for screening mammogram for malignant neoplasm of breast: Secondary | ICD-10-CM | POA: Diagnosis not present

## 2021-06-22 DIAGNOSIS — M6281 Muscle weakness (generalized): Secondary | ICD-10-CM | POA: Diagnosis not present

## 2021-06-22 DIAGNOSIS — M1712 Unilateral primary osteoarthritis, left knee: Secondary | ICD-10-CM | POA: Diagnosis not present

## 2021-06-22 DIAGNOSIS — R002 Palpitations: Secondary | ICD-10-CM

## 2021-06-22 NOTE — Patient Instructions (Signed)
Medication Instructions:  Your physician recommends that you continue on your current medications as directed. Please refer to the Current Medication list given to you today.  *If you need a refill on your cardiac medications before your next appointment, please call your pharmacy*   Lab Work: none If you have labs (blood work) drawn today and your tests are completely normal, you will receive your results only by: MyChart Message (if you have MyChart) OR A paper copy in the mail If you have any lab test that is abnormal or we need to change your treatment, we will call you to review the results.   Testing/Procedures: Dr Elease Hashimoto recommends you have a Calcium Score CT Scan done   Follow-Up: At [facility], you and your health needs are our priority.  As part of our continuing mission to provide you with exceptional heart care, we have created designated Provider Care Teams.  These Care Teams include your primary Cardiologist (physician) and Advanced Practice Providers (APPs -  Physician Assistants and Nurse Practitioners) who all work together to provide you with the care you need, when you need it.  We recommend signing up for the patient portal called "MyChart".  Sign up information is provided on this After Visit Summary.  MyChart is used to connect with patients for Virtual Visits (Telemedicine).  Patients are able to view lab/test results, encounter notes, upcoming appointments, etc.  Non-urgent messages can be sent to your provider as well.   To learn more about what you can do with MyChart, go to [email or web address].    Your next appointment:   12 month(s)  The format for your next appointment:   In Person  Provider:   [name], MD     Other Instructions

## 2021-06-23 ENCOUNTER — Telehealth: Payer: Self-pay | Admitting: *Deleted

## 2021-06-23 NOTE — Telephone Encounter (Signed)
   [facility] Pre-operative Risk Assessment    Patient Name: Erin Arnold  DOB: November 20, 1955 MRN: [id number]  HEARTCARE STAFF:  - IMPORTANT!!!!!! Under Visit Info/Reason for Call, type in Other and utilize the format Clearance MM/DD/YY or Clearance TBD. Do not use dashes or single digits. - Please review there is not already an duplicate clearance open for this procedure. - If request is for dental extraction, please clarify the # of teeth to be extracted. - If the patient is currently at the dentist's office, call Pre-Op Callback Staff (MA/nurse) to input urgent request.  - If the patient is not currently in the dentist office, please route to the Pre-Op pool.  Request for surgical clearance:  What type of surgery is being performed?   LEFT TOTAL KNEE REPLACEMENT  When is this surgery scheduled?  06/25/2021  What type of clearance is required (medical clearance vs. Pharmacy clearance to hold med vs. Both)?  MEDICAL  Are there any medications that need to be held prior to surgery and how long?  N/A  Practice name and name of physician performing surgery?  [facility] / DR. LANDAU  What is the office phone number?  [phone]   7.   What is the office fax number?  [phone] ATTN:  SHERRI  8.   Anesthesia type (None, local, MAC, general) ?     Jeanann Lewandowsky 06/23/2021, 6:52 AM  _________________________________________________________________   (provider comments below)

## 2021-06-23 NOTE — Telephone Encounter (Signed)
I have faxed Dr. Harvie Bridge note containing clearance.

## 2021-06-25 DIAGNOSIS — M1712 Unilateral primary osteoarthritis, left knee: Secondary | ICD-10-CM | POA: Diagnosis not present

## 2021-07-13 DIAGNOSIS — H903 Sensorineural hearing loss, bilateral: Secondary | ICD-10-CM | POA: Diagnosis not present

## 2021-08-17 ENCOUNTER — Other Ambulatory Visit: Payer: Self-pay

## 2021-08-17 ENCOUNTER — Ambulatory Visit (INDEPENDENT_AMBULATORY_CARE_PROVIDER_SITE_OTHER)
Admission: RE | Admit: 2021-08-17 | Discharge: 2021-08-17 | Disposition: A | Discharge status: Home / Self Care | Payer: Self-pay | Source: Ambulatory Visit | Attending: Cardiovascular Disease | Admitting: Cardiovascular Disease

## 2021-08-17 DIAGNOSIS — R002 Palpitations: Secondary | ICD-10-CM

## 2021-08-17 DIAGNOSIS — E785 Hyperlipidemia, unspecified: Secondary | ICD-10-CM

## 2021-08-21 DIAGNOSIS — R454 Irritability and anger: Secondary | ICD-10-CM | POA: Diagnosis not present

## 2021-08-21 DIAGNOSIS — N289 Disorder of kidney and ureter, unspecified: Secondary | ICD-10-CM | POA: Diagnosis not present

## 2021-08-21 DIAGNOSIS — E7800 Pure hypercholesterolemia, unspecified: Secondary | ICD-10-CM | POA: Diagnosis not present

## 2021-08-21 DIAGNOSIS — Z131 Encounter for screening for diabetes mellitus: Secondary | ICD-10-CM | POA: Diagnosis not present

## 2021-08-21 DIAGNOSIS — G479 Sleep disorder, unspecified: Secondary | ICD-10-CM | POA: Diagnosis not present

## 2021-08-24 ENCOUNTER — Encounter: Payer: Self-pay | Admitting: Cardiovascular Disease

## 2021-09-04 DIAGNOSIS — M1712 Unilateral primary osteoarthritis, left knee: Secondary | ICD-10-CM | POA: Diagnosis not present

## 2021-11-13 DIAGNOSIS — M25562 Pain in left knee: Secondary | ICD-10-CM | POA: Diagnosis not present

## 2021-11-13 DIAGNOSIS — Z791 Long term (current) use of non-steroidal anti-inflammatories (NSAID): Secondary | ICD-10-CM | POA: Diagnosis not present

## 2021-11-13 DIAGNOSIS — M1811 Unilateral primary osteoarthritis of first carpometacarpal joint, right hand: Secondary | ICD-10-CM | POA: Diagnosis not present

## 2022-06-09 ENCOUNTER — Telehealth: Payer: Self-pay

## 2022-06-09 NOTE — Patient Outreach (Signed)
  Care Coordination   06/09/2022 Name: Erin Arnold MRN: [id number] DOB: 1956-02-04   Care Coordination Outreach Attempts:  An unsuccessful telephone outreach was attempted today to offer the patient information about available care coordination services as a benefit of their health plan.   Follow Up Plan:  Additional outreach attempts will be made to offer the patient care coordination information and services.   Encounter Outcome:  No Answer  Care Coordination Interventions Activated:  No   Care Coordination Interventions:  No, not indicated    Dudley Major RN, BSN,CCM, CDE Care Management Coordinator [facility] Care Management [phone]

## 2022-07-06 ENCOUNTER — Encounter: Payer: Self-pay | Admitting: Cardiovascular Disease

## 2022-07-14 ENCOUNTER — Ambulatory Visit: Payer: BC Managed Care – PPO | Admitting: Cardiovascular Disease

## 2022-08-18 ENCOUNTER — Encounter: Payer: Self-pay | Admitting: Cardiovascular Disease

## 2022-08-18 NOTE — Progress Notes (Unsigned)
Erin Arnold Date of Birth  [birth year] [place] [address]    [place]   [place], [place]  [address]    [place], [place]  [address] [phone]  Fax  [phone]  [phone]  Fax [phone]  Problem List: 1. Palpitations 2. Hx of colitis - following a C.Diff infection    Erin Arnold is a 67 yo with a history of palpitations. She's had these palpitations for several years. These are typically described as heart irregularities followed by very strong heartbeats. On one occasion she's had a prolonged episode of palpitations.  These typically occur at night when she's relaxing. They also occur during the day. She does  not have any specific association for these palpitations. These episodes are associated with syncope, presyncope, chest pain, or shortness breath.  She has not had any problems.  Her palpitations have been well controlled. She is scheduled to have arm surgery.    November 10, 2012:  Erin Arnold is doing well from a cardiac standpoint.  She still have occasional palps.   She has had left radial nerve decompression  06/23/2015:  Doing well.   Here to get her Toprol 12.5 mg  refilled.  No side effects.  Exercising regularly .    April 28, 2018: Erin Arnold seen back today for further evaluation of her palpitations.  She is having some lots of orthopedic issues. Has brief episodes of palpitations.  These last for typically only 1 second.  No prolonged episodes of heartbeat irregularities. Not exercising regularly.  She is limited by her orthopedic issues. Has not been taking her Toprol  -   Nov. 28, 2022 Erin Arnold is seen today for follow up  of her palpitations. She was last seen 3 years ago  Very rare palpitations. Last for a split second  - clinically sound like a PVC More frequent at night when she is is relaxing  No  CP , no dyspnea Is having L knee replacement surgery on Thursday  She is at low risk for for her  upcoming surgery .   Has already had R TKA in March, 2022  Jan. 25, 2024  Erin Arnold is seen today of her palpatations.  Has HLD  Coronary calcium score of 18.5. This was 66th percentile for age-, race-, and sex-matched controls.  Has had a corneal abrasion, leading to eye infection   No significant palpitations     Current Outpatient Medications on File Prior to Visit  Medication Sig Dispense Refill   albuterol (PROVENTIL HFA;VENTOLIN HFA) 108 (90 Base) MCG/ACT inhaler Inhale 2 puffs into the lungs every 6 (six) hours as needed for wheezing or shortness of breath. 1 Inhaler 2   Ascorbic Acid (VITAMIN C PO) Take 1 tablet by mouth daily.     CA-MG-VIT D-L METHYLFOL-B6-B12 PO Take 1 tablet by mouth daily.      Coenzyme Q10 10 MG capsule Take 1 capsule by mouth daily.     LYSINE PO Take 1 mg by mouth daily.      magnesium 30 MG tablet Take 30 mg by mouth daily.      meloxicam (MOBIC) 15 MG tablet Take 15 mg by mouth daily as needed.     Menaquinone-7 (VITAMIN K2) 100 MCG CAPS Take 100 mcg by mouth daily.     Multiple Vitamins-Minerals (ZINC PO) Take 1 tablet by mouth daily.      Probiotic Product (PROBIOTIC FORMULA PO) Take 1  tablet by mouth daily.      sertraline (ZOLOFT) 50 MG tablet Take 1 tablet by mouth daily.     traZODone (DESYREL) 50 MG tablet Take 50 mg by mouth at bedtime as needed for sleep.      Turmeric 500 MG CAPS Take 500 mg by mouth daily.      valACYclovir (VALTREX) 1000 MG tablet Take 500 mg by mouth daily as needed (flare ups).     fluticasone (FLONASE) 50 MCG/ACT nasal spray Place 1 spray into the nose daily as needed.     No current facility-administered medications on file prior to visit.    No Known Allergies  Past Medical History:  Diagnosis Date   Carpal tunnel syndrome of right wrist 05/2013   Constipation    occasional   GERD (gastroesophageal reflux disease)    OTC as needed   Headache(784.0)    sinus   History of colitis    History of  palpitations    under control with Metoprolol   PMDD (premenstrual dysphoric disorder)    takes HCTZ to control sx.   PONV (postoperative nausea and vomiting)    Seasonal allergies     Past Surgical History:  Procedure Laterality Date   CARPAL TUNNEL RELEASE Right 06/14/2013   Procedure: CARPAL TUNNEL RELEASE; RIGHT ;  Surgeon: Ninetta Lights, MD;  Location: [place];  Service: Orthopedics;  Laterality: Right;   ELBOW ARTHROSCOPY Left 02/08/2013   Procedure: LEFT ELBOW ARTHROSCOPY WITH DEBRIDEMENT LIMITED/ TENNIS ELBOW  REPAIR, PARTIAL SYNOVECTOMY AND CHONDROPLASTY;  Surgeon: Ninetta Lights, MD;  Location: [place];  Service: Orthopedics;  Laterality: Left;   HIP PINNING,CANNULATED Right 07/09/2019   Procedure: CANNULATED HIP PINNING;  Surgeon: Marchia Bond, MD;  [facility];  Service: Orthopedics;  Laterality: Right;   KNEE ARTHROSCOPY W/ ACL RECONSTRUCTION Left 02/18/2011   x 2   WRIST SURGERY Left 05/26/2012   radial nerve decompression    Social History   Tobacco Use  Smoking Status Former   Types: Cigarettes   Quit date: 07/26/1976   Years since quitting: 46.0  Smokeless Tobacco Never    Social History   Substance and Sexual Activity  Alcohol Use Yes   Comment: occasional beer    History reviewed. No pertinent family history.  Reviw of Systems:  Reviewed in the HPI.  All other systems are negative.  Physical Exam: Blood pressure 114/72, pulse 70, height 5\' 7"  (1.702 m), weight 170 lb 9.6 oz (77.4 kg), SpO2 98 %.       GEN:  Well nourished, well developed in no acute distress HEENT: Normal NECK: No JVD; No carotid bruits LYMPHATICS: No lymphadenopathy CARDIAC: RRR , soft systolic murmur  RESPIRATORY:  Clear to auscultation without rales, wheezing or rhonchi  ABDOMEN: Soft, non-tender, non-distended MUSCULOSKELETAL:  No edema; No deformity  SKIN: Warm and dry NEUROLOGIC:  Alert and oriented x 3   ECG: August 19, 2022: Normal sinus rhythm.  Heart rate 70.  Poor R wave progression-possible anterior wall myocardial infarction   Assessment / Plan:   1. Palpitations: She has rare premature ventricular contractions.  These are fairly benign.     2. Hyperlipidemia:   She has had mild hyperlipidemia for years.   Coronary calcium score is fairly low but it does place her in the 66 percentile.  I have suggested that we start her on some cholesterol-lowering medications.  She is not interested in doing that right now.  Will follow-up with her in a year     [name], MD  08/19/2022 3:52 PM    [facility] [address] [place],  [address] [place], [place]  [address] Pager [phone] Phone: [phone]; Fax: [phone]

## 2022-08-19 ENCOUNTER — Encounter: Payer: Self-pay | Admitting: Cardiovascular Disease

## 2022-08-19 ENCOUNTER — Ambulatory Visit: Payer: Medicare Other | Attending: Cardiovascular Disease | Admitting: Cardiovascular Disease

## 2022-08-19 VITALS — BP 114/72 | HR 70 | Ht 67.0 in | Wt 170.6 lb

## 2022-08-19 DIAGNOSIS — R002 Palpitations: Secondary | ICD-10-CM | POA: Diagnosis present

## 2022-08-19 DIAGNOSIS — E785 Hyperlipidemia, unspecified: Secondary | ICD-10-CM | POA: Diagnosis present

## 2022-08-19 NOTE — Patient Instructions (Signed)
Medication Instructions:  Your physician recommends that you continue on your current medications as directed. Please refer to the Current Medication list given to you today.  *If you need a refill on your cardiac medications before your next appointment, please call your pharmacy*  Lab Work: None ordered  Testing/Procedures: None ordered  Follow-Up: At [facility], you and your health needs are our priority.  As part of our continuing mission to provide you with exceptional heart care, we have created designated Provider Care Teams.  These Care Teams include your primary Cardiologist (physician) and Advanced Practice Providers (APPs -  Physician Assistants and Nurse Practitioners) who all work together to provide you with the care you need, when you need it.  Your next appointment:   1 year(s)  The format for your next appointment:   In Person  Provider:   Philip Nahser, MD { 

## 2022-09-01 ENCOUNTER — Other Ambulatory Visit: Payer: Self-pay | Admitting: Family Medicine

## 2022-09-01 DIAGNOSIS — M8580 Other specified disorders of bone density and structure, unspecified site: Secondary | ICD-10-CM

## 2022-09-17 ENCOUNTER — Encounter: Payer: Self-pay | Admitting: Family Medicine

## 2022-10-22 ENCOUNTER — Ambulatory Visit (INDEPENDENT_AMBULATORY_CARE_PROVIDER_SITE_OTHER): Payer: Self-pay | Admitting: Plastic Surgery

## 2022-10-22 ENCOUNTER — Encounter: Payer: Self-pay | Admitting: Plastic Surgery

## 2022-10-22 DIAGNOSIS — Z719 Counseling, unspecified: Secondary | ICD-10-CM

## 2022-10-22 NOTE — Progress Notes (Signed)
Botulinum Toxin and Filler Injection Procedure Note  Procedure: Cosmetic botulinum toxin and Filler administration  Pre-operative Diagnosis: Dynamic rhytides and midface volume loss  Post-operative Diagnosis: Same  Complications:  None  Brief history: The patient desires botulinum toxin injection of her forehead. I discussed with the patient this proposed procedure of botulinum toxin injections, which is customized depending on the particular needs of the patient. It is performed on facial rhytids as a temporary correction. The alternatives were discussed with the patient. The risks were addressed including bleeding, scarring, infection, damage to deeper structures, asymmetry, and chronic pain, which may occur infrequently after a procedure. The individual's choice to undergo a surgical procedure is based on the comparison of risks to potential benefits. Other risks include unsatisfactory results, brow ptosis, eyelid ptosis, allergic reaction, temporary paralysis, which should go away with time, bruising, blurring disturbances and delayed healing. Botulinum toxin injections do not arrest the aging process or produce permanent tightening of the eyelid.  Operative intervention maybe necessary to maintain the results of a blepharoplasty or botulinum toxin. The patient understands and wishes to proceed.  Procedure: The area was prepped with alcohol and dried with a clean gauze. Using a clean technique, the botulinum toxin was diluted with 1.25 cc of preservative-free normal saline which was slowly injected with an 18 gauge needle in a tuberculin syringes.  A 32 gauge needles were then used to inject the botulinum toxin. This mixture allow for an aliquot of 4 units per 0.1 cc in each injection site.    Subsequently the mixture was injected in the glabellar and forehead area with preservation of the temporal branch to the lateral eyebrow as well as into each lateral canthal area beginning from the lateral  orbital rim medial to the zygomaticus major in 3 separate areas. A total of 30 Units of botulinum toxin was used. The forehead and glabellar area was injected with care to inject intramuscular only while holding pressure on the supratrochlear vessels in each area during each injection on either side of the medial corrugators. The injection proceeded vertically superiorly to the medial 2/3 of the frontalis muscle and superior 2/3 of the lateral frontalis, again with preservation of the frontal branch.  The midface area was injected at the 3 sub-regions of the mid-face: zygomaticomalar region, anteromedial cheek region, and submalar region for a total of one syringe on each side of the face. The technique used was serial puncture with equal injections in the 3 sub-regions: the zygomaticomalar region, the anteromedial cheek, and the submalar region.  The vollure was used for the nasolabial folds and the lateral perioral area. No complications were noted. Light pressure was held for 5 minutes. She was instructed explicitly in post-operative care.  Botox LOT:  [id number]  Restylane Contour 1 ml LOT: [id number]  Vollure XC 1 ml LOT: [id number]

## 2023-01-28 ENCOUNTER — Ambulatory Visit
Admission: RE | Admit: 2023-01-28 | Discharge: 2023-01-28 | Disposition: A | Discharge status: Home / Self Care | Payer: Medicare Other | Source: Ambulatory Visit | Attending: Family Medicine | Admitting: Family Medicine

## 2023-01-28 DIAGNOSIS — M8580 Other specified disorders of bone density and structure, unspecified site: Secondary | ICD-10-CM

## 2023-04-28 ENCOUNTER — Encounter: Payer: Self-pay | Admitting: Cardiovascular Disease

## 2023-04-28 NOTE — Progress Notes (Signed)
[name] Date of Birth  12-04-1955 [name]     [name] Office  [address]    [address]   [address] [place], [place]  [address]    [name], [place]  [address] [phone]  Fax  [phone]  [phone]  Fax [phone]  Problem List: 1. Palpitations 2. Hx of colitis - following a C.Diff infection    Erin Arnold is a 67 yo with a history of palpitations. She's had these palpitations for several years. These are typically described as heart irregularities followed by very strong heartbeats. On one occasion she's had a prolonged episode of palpitations.  These typically occur at night when she's relaxing. They also occur during the day. She does  not have any specific association for these palpitations. These episodes are associated with syncope, presyncope, chest pain, or shortness breath.  She has not had any problems.  Her palpitations have been well controlled. She is scheduled to have arm surgery.    November 10, 2012:  Erin Arnold is doing well from a cardiac standpoint.  She still have occasional palps.   She has had left radial nerve decompression  06/23/2015:  Doing well.   Here to get her Toprol 12.5 mg  refilled.  No side effects.  Exercising regularly .    April 28, 2018: Erin Arnold seen back today for further evaluation of her palpitations.  She is having some lots of orthopedic issues. Has brief episodes of palpitations.  These last for typically only 1 second.  No prolonged episodes of heartbeat irregularities. Not exercising regularly.  She is limited by her orthopedic issues. Has not been taking her Toprol  -   Nov. 28, 2022 Erin Arnold is seen today for follow up  of her palpitations. She was last seen 3 years ago  Very rare palpitations. Last for a split second  - clinically sound like a PVC More frequent at night when she is is relaxing  No  CP , no dyspnea Is having L knee replacement surgery on Thursday  She is at low risk for for her  upcoming surgery .   Has already had R TKA in March, 2022  Jan. 25, 2024  Erin Arnold is seen today of her palpatations.  Has HLD  Coronary calcium score of 18.5. This was 66th percentile for age-, race-, and sex-matched controls.  Has had a corneal abrasion, leading to eye infection   No significant palpitations     Oct. 4, 2024 Erin Arnold is seen for follow up of her PVCs, HTN, HLD  Has had a stressful month, Some vague tightness in her chest  CAC score is 18.5 (66th percentile)   She occasionally has some episodes of orthostatic hypotension.  Her blood pressure is low normal.  She she quite likely will need to have eye surgery soon.  She is at low risk for her upcoming eye surgery.   Current Outpatient Medications on File Prior to Visit  Medication Sig Dispense Refill   albuterol (PROVENTIL HFA;VENTOLIN HFA) 108 (90 Base) MCG/ACT inhaler Inhale 2 puffs into the lungs every 6 (six) hours as needed for wheezing or shortness of breath. 1 Inhaler 2   Ascorbic Acid (VITAMIN C PO) Take 1 tablet by mouth daily.     CA-MG-VIT D-L METHYLFOL-B6-B12 PO Take 1 tablet by mouth daily.      Coenzyme Q10 10 MG capsule Take 1 capsule by mouth daily.     fluticasone (FLONASE) 50 MCG/ACT nasal spray Place 1  spray into the nose daily as needed.     LYSINE PO Take 1 mg by mouth daily.      magnesium 30 MG tablet Take 30 mg by mouth daily.      meloxicam (MOBIC) 15 MG tablet Take 15 mg by mouth daily as needed.     Menaquinone-7 (VITAMIN K2) 100 MCG CAPS Take 100 mcg by mouth daily.     Multiple Vitamins-Minerals (ZINC PO) Take 1 tablet by mouth daily.      Probiotic Product (PROBIOTIC FORMULA PO) Take 1 tablet by mouth daily.      sertraline (ZOLOFT) 50 MG tablet Take 1 tablet by mouth daily.     traZODone (DESYREL) 50 MG tablet Take 50 mg by mouth at bedtime as needed for sleep.      Turmeric 500 MG CAPS Take 500 mg by mouth daily.      valACYclovir (VALTREX) 1000 MG tablet Take 500 mg by mouth  daily as needed (flare ups).     No current facility-administered medications on file prior to visit.    No Known Allergies  Past Medical History:  Diagnosis Date   Carpal tunnel syndrome of right wrist 05/2013   Constipation    occasional   GERD (gastroesophageal reflux disease)    OTC as needed   Headache(784.0)    sinus   History of colitis    History of palpitations    under control with Metoprolol   PMDD (premenstrual dysphoric disorder)    takes HCTZ to control sx.   PONV (postoperative nausea and vomiting)    Seasonal allergies     Past Surgical History:  Procedure Laterality Date   CARPAL TUNNEL RELEASE Right 06/14/2013   Procedure: CARPAL TUNNEL RELEASE; RIGHT ;  Surgeon: Loreta Ave, MD;  Location: [facility];  Service: Orthopedics;  Laterality: Right;   ELBOW ARTHROSCOPY Left 02/08/2013   Procedure: LEFT ELBOW ARTHROSCOPY WITH DEBRIDEMENT LIMITED/ TENNIS ELBOW  REPAIR, PARTIAL SYNOVECTOMY AND CHONDROPLASTY;  Surgeon: Loreta Ave, MD;  Location: [facility];  Service: Orthopedics;  Laterality: Left;   HIP PINNING,CANNULATED Right 07/09/2019   Procedure: CANNULATED HIP PINNING;  Surgeon: Teryl Lucy, MD;  Location: [facility];  Service: Orthopedics;  Laterality: Right;   KNEE ARTHROSCOPY W/ ACL RECONSTRUCTION Left 02/18/2011   x 2   WRIST SURGERY Left 05/26/2012   radial nerve decompression    Social History   Tobacco Use  Smoking Status Former   Current packs/day: 0.00   Types: Cigarettes   Quit date: 07/26/1976   Years since quitting: 46.7  Smokeless Tobacco Never    Social History   Substance and Sexual Activity  Alcohol Use Yes   Comment: occasional beer    History reviewed. No pertinent family history.  Reviw of Systems:  Reviewed in the HPI.  All other systems are negative.   Physical Exam: Blood pressure 102/60, pulse 66, height 5\' 7"  (1.702 m), weight 179 lb (81.2 kg), SpO2 98%.       GEN:  Well  nourished, well developed in no acute distress HEENT: Normal NECK: No JVD; No carotid bruits LYMPHATICS: No lymphadenopathy CARDIAC: RRR , no murmurs, rubs, gallops RESPIRATORY:  Clear to auscultation without rales, wheezing or rhonchi  ABDOMEN: Soft, non-tender, non-distended MUSCULOSKELETAL:  No edema; No deformity  SKIN: Warm and dry NEUROLOGIC:  Alert and oriented x 3    ECG:   EKG Interpretation Date/Time:  Friday April 29 2023 08:15:33 EDT Ventricular Rate:  66 PR Interval:  150 QRS Duration:  76 QT Interval:  410 QTC Calculation: 429 R Axis:   23  Text Interpretation: Sinus rhythm with occasional Premature ventricular complexes Cannot rule out Anterior infarct , age undetermined When compared with ECG of 08-Jul-2019 21:25, No significant change since last tracing Confirmed by [name] ([id number]) on 04/29/2023 8:28:38 AM      Assessment / Plan:   1. Palpitations:   has PVCs.   PVCs are stable.   2. Hyperlipidemia: Lipids are stable.  She has a relatively low coronary calcium score.  Will continue to work on cholesterol management.  3.  Chest tightness: She has had intermittent episodes of chest tightness that she thinks might be just due to the weather.  I have advised her to let us know if these episodes of chest tightness seem to worsen.     [name], MD  04/29/2023 10:51 AM    [facility] [address] [place],  [address] [place], [place]  [address] Pager [phone] Phone: [phone]; Fax: [phone]

## 2023-04-29 ENCOUNTER — Encounter: Payer: Self-pay | Admitting: Cardiovascular Disease

## 2023-04-29 ENCOUNTER — Ambulatory Visit: Payer: Medicare Other | Attending: Cardiovascular Disease | Admitting: Cardiovascular Disease

## 2023-04-29 VITALS — BP 102/60 | HR 66 | Ht 67.0 in | Wt 179.0 lb

## 2023-04-29 DIAGNOSIS — R002 Palpitations: Secondary | ICD-10-CM | POA: Diagnosis present

## 2023-04-29 DIAGNOSIS — I493 Ventricular premature depolarization: Secondary | ICD-10-CM | POA: Insufficient documentation

## 2023-04-29 NOTE — Patient Instructions (Signed)
Medication Instructions:  Your physician recommends that you continue on your current medications as directed. Please refer to the Current Medication list given to you today.  *If you need a refill on your cardiac medications before your next appointment, please call your pharmacy*   Lab Work: NONE If you have labs (blood work) drawn today and your tests are completely normal, you will receive your results only by: MyChart Message (if you have MyChart) OR A paper copy in the mail If you have any lab test that is abnormal or we need to change your treatment, we will call you to review the results.   Testing/Procedures: NONE   Follow-Up: At [facility], you and your health needs are our priority.  As part of our continuing mission to provide you with exceptional heart care, we have created designated Provider Care Teams.  These Care Teams include your primary Cardiologist (physician) and Advanced Practice Providers (APPs -  Physician Assistants and Nurse Practitioners) who all work together to provide you with the care you need, when you need it.  We recommend signing up for the patient portal called "MyChart".  Sign up information is provided on this After Visit Summary.  MyChart is used to connect with patients for Virtual Visits (Telemedicine).  Patients are able to view lab/test results, encounter notes, upcoming appointments, etc.  Non-urgent messages can be sent to your provider as well.   To learn more about what you can do with MyChart, go to [email or web address].    Your next appointment:   1 year(s)  Provider:   Tonny Bollman

## 2024-05-01 ENCOUNTER — Ambulatory Visit: Attending: Internal Medicine | Admitting: Internal Medicine

## 2024-05-01 ENCOUNTER — Encounter: Payer: Self-pay | Admitting: Internal Medicine

## 2024-05-01 ENCOUNTER — Ambulatory Visit (INDEPENDENT_AMBULATORY_CARE_PROVIDER_SITE_OTHER)

## 2024-05-01 VITALS — BP 122/74 | HR 76 | Ht 67.5 in | Wt 181.8 lb

## 2024-05-01 DIAGNOSIS — R9431 Abnormal electrocardiogram [ECG] [EKG]: Secondary | ICD-10-CM | POA: Insufficient documentation

## 2024-05-01 DIAGNOSIS — R002 Palpitations: Secondary | ICD-10-CM

## 2024-05-01 DIAGNOSIS — I493 Ventricular premature depolarization: Secondary | ICD-10-CM

## 2024-05-01 DIAGNOSIS — E785 Hyperlipidemia, unspecified: Secondary | ICD-10-CM | POA: Diagnosis not present

## 2024-05-01 NOTE — Progress Notes (Signed)
  Cardiology Office Note   Date:  05/01/2024  ID:  Erin Arnold, Erin Arnold 08/07/1955, MRN [id number] PCP: Auston Opal, DO  [facility] Providers Cardiologist:  Aleene Passe, MD (Inactive)     History of Present Illness Erin  L Arnold is a 68 y.o. female with a history of elevated coronary calcium , hyperlipidemia, PVCs, carpal tunnel syndrome who has been following for palpitations and presents for 1 year follow-up.  She occasionally gets palpitations especially the past 24 hours but she is anxious about getting an MRI today for her back.  Otherwise she does not get chest pain, shortness of breath or any other complaints.  She does not want to start any statin for her cholesterol or calcium  score.  Tobacco use: No Alcohol use: Occasional Activity level: Walks at a moderate pace daily Diet mainly consists of: Protein and veggies    ROS:  Review of Systems  All other systems reviewed and are negative.   Physical Exam  Physical Exam Vitals and nursing note reviewed.  Constitutional:      Appearance: Normal appearance.  HENT:     Head: Normocephalic and atraumatic.  Eyes:     Conjunctiva/sclera: Conjunctivae normal.  Neck:     Vascular: No carotid bruit.  Cardiovascular:     Rate and Rhythm: Normal rate and regular rhythm.  Pulmonary:     Effort: Pulmonary effort is normal.     Breath sounds: Normal breath sounds.  Musculoskeletal:        General: No swelling or tenderness.  Skin:    Coloration: Skin is not jaundiced or pale.  Neurological:     Mental Status: She is alert.     VS:  BP 122/74   Pulse 76   Ht 5' 7.5 (1.715 m)   Wt 181 lb 12.8 oz (82.5 kg)   SpO2 96%   BMI 28.05 kg/m         Wt Readings from Last 3 Encounters:  05/01/24 181 lb 12.8 oz (82.5 kg)  04/29/23 179 lb (81.2 kg)  08/19/22 170 lb 9.6 oz (77.4 kg)     EKG Interpretation Date/Time:  Tuesday May 01 2024 07:55:30 EDT Ventricular Rate:  76 PR Interval:  136 QRS  Duration:  74 QT Interval:  360 QTC Calculation: 405 R Axis:   19  Text Interpretation: Sinus rhythm with occasional Premature ventricular complexes When compared with ECG of 29-Apr-2023 08:15, No significant change was found Confirmed by Kriste Hicks [phone]) on 05/01/2024 8:00:07 AM    Studies Reviewed   Coronary calcium  score 08/17/2021: 18.5: Exclusively in LAD (66 percentile for cohort)     Risk Assessment/Calculations             ASSESSMENT  PVCs has symptomatic and asymptomatic PVCs Hyperlipidemia We discussed risks and benefits but she is declining any antilipid therapy Elevated coronary calcium     Plan  Echocardiogram to get a baseline ejection fraction given longstanding PVCs 3-day event monitor to evaluate PVC burden Cardiac risk counseling and prevention recommendations: Heart healthy/Mediterranean diet with whole grains, fruits, vegetable, fish, lean meats, nuts, and olive oil. Limit salt. Moderate walking, 3-5 times/week for 30-50 minutes each session. Aim for at least 150 minutes.week. Goal should be pace of 3 miles/hour, or walking 1.5 miles in 30 minutes Avoidance of tobacco products. Avoid excess alcohol.  Follow up: 1 year          Signed, Hicks FORBES Kriste, MD

## 2024-05-01 NOTE — Patient Instructions (Addendum)
 Medication Instructions:  Your physician recommends that you continue on your current medications as directed. Please refer to the Current Medication list given to you today.  *If you need a refill on your cardiac medications before your next appointment, please call your pharmacy*  Lab Work: Lipids If you have labs (blood work) drawn today and your tests are completely normal, you will receive your results only by: MyChart Message (if you have MyChart) OR A paper copy in the mail If you have any lab test that is abnormal or we need to change your treatment, we will call you to review the results.  Testing/Procedures: Your physician has requested that you have an echocardiogram. Echocardiography is a painless test that uses sound waves to create images of your heart. It provides your doctor with information about the size and shape of your heart and how well your heart's chambers and valves are working. This procedure takes approximately one hour. There are no restrictions for this procedure. Please do NOT wear cologne, perfume, aftershave, or lotions (deodorant is allowed). Please arrive 15 minutes prior to your appointment time.  Please note: We ask at that you not bring children with you during ultrasound (echo/ vascular) testing. Due to room size and safety concerns, children are not allowed in the ultrasound rooms during exams. Our front office staff cannot provide observation of children in our lobby area while testing is being conducted. An adult accompanying a patient to their appointment will only be allowed in the ultrasound room at the discretion of the ultrasound technician under special circumstances. We apologize for any inconvenience.       ZIO XT- Long Term Monitor Instructions  Your physician has requested you wear a ZIO patch monitor for 3 days.  This is a single patch monitor. [employer] supplies one patch monitor per enrollment. Additional stickers are not available. Please  do not apply patch if you will be having a Nuclear Stress Test,  Echocardiogram, Cardiac CT, MRI, or Chest Xray during the period you would be wearing the  monitor. The patch cannot be worn during these tests. You cannot remove and re-apply the  ZIO XT patch monitor.  Billing and Patient Assistance Program Information  We have supplied [employer] with any of your insurance information on file for billing purposes. [employer] offers a sliding scale Patient Assistance Program for patients that do not have  insurance, or whose insurance does not completely cover the cost of the ZIO monitor.  You must apply for the Patient Assistance Program to qualify for this discounted rate.  To apply, please call [employer] at [phone], select option 4, select option 2, ask to apply for  Patient Assistance Program. Meredeth will ask your household income, and how many people  are in your household. They will quote your out-of-pocket cost based on that information.  [employer] will also be able to set up a 42-month, interest-free payment plan if needed.  When you are ready to remove the patch, follow instructions on the last 2 pages of Patient  Logbook. Stick patch monitor onto the last page of Patient Logbook.  Place Patient Logbook in the blue and white box. Use locking tab on box and tape box closed  securely. The blue and white box has prepaid postage on it. Please place it in the mailbox as  soon as possible. Your physician should have your test results approximately 7 days after the  monitor has been mailed back to [facility].  Call [facility] Customer Care at  [phone] if you have questions regarding  your ZIO XT patch monitor. Call them immediately if you see an orange light blinking on your  monitor.  If your monitor falls off in less than 4 days, contact our Monitor department at [phone].  If your monitor becomes loose or falls off after 4 days call [employer] at [phone] for   suggestions on securing your monitor   Follow-Up: At [facility], you and your health needs are our priority.  As part of our continuing mission to provide you with exceptional heart care, our providers are all part of one team.  This team includes your primary Cardiologist (physician) and Advanced Practice Providers or APPs (Physician Assistants and Nurse Practitioners) who all work together to provide you with the care you need, when you need it.  Your next appointment:   1 year(s)  Provider:   Emeline FORBES Calender, MD

## 2024-05-01 NOTE — Progress Notes (Unsigned)
Applied a 3 day Zio XT monitor to patient in the office 

## 2024-05-02 ENCOUNTER — Ambulatory Visit: Payer: Self-pay | Admitting: Internal Medicine

## 2024-05-02 DIAGNOSIS — I493 Ventricular premature depolarization: Secondary | ICD-10-CM

## 2024-05-02 LAB — LIPID PANEL
Chol/HDL Ratio: 4.1 ratio (ref 0.0–4.4)
Cholesterol, Total: 203 mg/dL — ABNORMAL HIGH (ref 100–199)
HDL: 49 mg/dL (ref >39)
LDL Chol Calc (NIH): 127 mg/dL — ABNORMAL HIGH (ref 0–99)
Triglycerides: 149 mg/dL (ref 0–149)
VLDL Cholesterol Cal: 27 mg/dL (ref 5–40)

## 2024-05-25 DIAGNOSIS — R002 Palpitations: Secondary | ICD-10-CM

## 2024-05-25 DIAGNOSIS — E785 Hyperlipidemia, unspecified: Secondary | ICD-10-CM | POA: Diagnosis not present

## 2024-05-25 DIAGNOSIS — I493 Ventricular premature depolarization: Secondary | ICD-10-CM

## 2024-05-25 MED ORDER — METOPROLOL SUCCINATE ER 25 MG PO TB24
25.0000 mg | ORAL_TABLET | Freq: Every day | ORAL | 1 refills | Status: AC
Start: 2024-05-25 — End: ?

## 2024-05-31 ENCOUNTER — Ambulatory Visit (HOSPITAL_COMMUNITY)
Admission: RE | Admit: 2024-05-31 | Discharge: 2024-05-31 | Disposition: A | Discharge status: Home / Self Care | Source: Ambulatory Visit | Attending: Internal Medicine | Admitting: Internal Medicine

## 2024-05-31 DIAGNOSIS — E785 Hyperlipidemia, unspecified: Secondary | ICD-10-CM

## 2024-05-31 DIAGNOSIS — R9431 Abnormal electrocardiogram [ECG] [EKG]: Secondary | ICD-10-CM | POA: Diagnosis not present

## 2024-05-31 DIAGNOSIS — I493 Ventricular premature depolarization: Secondary | ICD-10-CM | POA: Diagnosis not present

## 2024-05-31 DIAGNOSIS — R002 Palpitations: Secondary | ICD-10-CM | POA: Diagnosis not present

## 2024-05-31 LAB — ECHOCARDIOGRAM COMPLETE
AR max vel: 2.28 cm2
AV Area VTI: 1.91 cm2
AV Area mean vel: 2.03 cm2
AV Mean grad: 4 mmHg
AV Peak grad: 6.8 mmHg
Ao pk vel: 1.3 m/s
Area-P 1/2: 3.35 cm2
S' Lateral: 2.94 cm

## 2024-07-17 ENCOUNTER — Encounter: Payer: Self-pay | Admitting: Cardiovascular Disease

## 2024-07-17 ENCOUNTER — Ambulatory Visit: Attending: Cardiovascular Disease | Admitting: Cardiovascular Disease

## 2024-07-17 VITALS — BP 120/86 | HR 68 | Ht 67.5 in | Wt 179.0 lb

## 2024-07-17 DIAGNOSIS — R002 Palpitations: Secondary | ICD-10-CM | POA: Diagnosis present

## 2024-07-17 DIAGNOSIS — I493 Ventricular premature depolarization: Secondary | ICD-10-CM | POA: Insufficient documentation

## 2024-07-17 NOTE — Patient Instructions (Signed)
 Medication Instructions:  Your physician has recommended you make the following change in your medication:  Stop current dose magnesium  Start Magnesium  taurate. One tablet daily   *If you need a refill on your cardiac medications before your next appointment, please call your pharmacy*  Lab Work: none If you have labs (blood work) drawn today and your tests are completely normal, you will receive your results only by: MyChart Message (if you have MyChart) OR A paper copy in the mail If you have any lab test that is abnormal or we need to change your treatment, we will call you to review the results.  Testing/Procedures: none  Follow-Up: At [facility], you and your health needs are our priority.  As part of our continuing mission to provide you with exceptional heart care, our providers are all part of one team.  This team includes your primary Cardiologist (physician) and Advanced Practice Providers or APPs (Physician Assistants and Nurse Practitioners) who all work together to provide you with the care you need, when you need it.  Your next appointment:   As needed  Provider:   Dr Nancey   We recommend signing up for the patient portal called MyChart.  Sign up information is provided on this After Visit Summary.  MyChart is used to connect with patients for Virtual Visits (Telemedicine).  Patients are able to view lab/test results, encounter notes, upcoming appointments, etc.  Non-urgent messages can be sent to your provider as well.   To learn more about what you can do with MyChart, go to [email or web address].   Other Instructions

## 2024-07-17 NOTE — Progress Notes (Signed)
" °  Electrophysiology Office Note:    Date:  07/17/2024   ID:  Cassity, Christian Dec 03, 1955, MRN [id number]  PCP:  Auston Opal, DO   [facility] Providers Cardiologist:  Emeline FORBES Calender, DO Electrophysiologist:  Eulas FORBES Furbish, MD     Referring MD: Calender Emeline FORBES, DO   History of Present Illness:    Erin Arnold is a 68 y.o. female with a medical history significant for PVCs, elevated coronary calcium , referred for arrhythmia management.      Discussed the use of AI scribe software for clinical note transcription with the patient, who gave verbal consent to proceed.  History of Present Illness Erin Arnold is a 68 year old female with premature ventricular contractions.  She has PVCs with prior palpitations about ten to twelve years ago but has been asymptomatic for the past decade without shortness of breath, edema, chest pain, lightheadedness, or significant palpitations. Current palpitations are mild and she feels acclimated to them.  A transthoracic echocardiogram was done in November 2025 and rhythm monitoring in October 2025.  She takes metoprolol  and a daily 30 mg magnesium  supplement. She remains very active and walks every day, and currently states she does not feel any palpitations.         Today, she reports she feels well and has no complaints  EKGs/Labs/Other Studies Reviewed Today:     Echocardiogram:  TTE November 2025 LVEF 55 to 60%.  No regional wall motion abnormalities.  Grade 1 diastolic dysfunction.  Mildly dilated left atrium.  Normal aortic and mitral valve structure and function.   Monitors:  3 day monitor October 2025-- my interpretation Sinus rhythm heart rate 59 to 145 bpm, average 89 bpm. 16.8% PVC burden.  Symptoms were associated with sinus rhythm.    EKG:   EKG Interpretation Date/Time:  Tuesday July 17 2024 08:35:10 EST Ventricular Rate:  68 PR Interval:  142 QRS Duration:  78 QT Interval:  380 QTC  Calculation: 404 R Axis:   27  Text Interpretation: Sinus rhythm with Fusion complexes When compared with ECG of 17-Jul-2024 08:34, No significant change was found Confirmed by Furbish Eulas [phone]) on 07/17/2024 8:37:09 AM     Physical Exam:    VS:  BP 120/86 (BP Location: Right Arm, Patient Position: Sitting, Cuff Size: Large)   Pulse 68   Ht 5' 7.5 (1.715 m)   Wt 179 lb (81.2 kg)   SpO2 99%   BMI 27.62 kg/m     Wt Readings from Last 3 Encounters:  07/17/24 179 lb (81.2 kg)  05/01/24 181 lb 12.8 oz (82.5 kg)  04/29/23 179 lb (81.2 kg)     GEN: Well nourished, well developed in no acute distress CARDIAC: RRR, no murmurs, rubs, gallops RESPIRATORY:  Normal work of breathing MUSCULOSKELETAL: no edema    ASSESSMENT & PLAN:     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    Signed, Eulas FORBES Furbish, MD  07/17/2024 8:55 AM    [facility] "

## 2024-07-18 NOTE — Addendum Note (Signed)
 Addended by: CHAUVIGNE, Asiah Browder on: 07/18/2024 07:29 AM   Modules accepted: Orders
